# Patient Record
Sex: Male | Born: 1970 | Race: White | Marital: Married | Smoking: Never smoker
Health system: Southern US, Community
[De-identification: ages and names within clinical notes are randomized; demographics above are authoritative.]

## PROBLEM LIST (undated history)

## (undated) DIAGNOSIS — G4730 Sleep apnea, unspecified: Secondary | ICD-10-CM

## (undated) DIAGNOSIS — G709 Myoneural disorder, unspecified: Secondary | ICD-10-CM

## (undated) DIAGNOSIS — K219 Gastro-esophageal reflux disease without esophagitis: Secondary | ICD-10-CM

## (undated) DIAGNOSIS — F32A Depression, unspecified: Secondary | ICD-10-CM

## (undated) DIAGNOSIS — T8859XA Other complications of anesthesia, initial encounter: Secondary | ICD-10-CM

## (undated) DIAGNOSIS — N189 Chronic kidney disease, unspecified: Secondary | ICD-10-CM

## (undated) DIAGNOSIS — A159 Respiratory tuberculosis unspecified: Secondary | ICD-10-CM

## (undated) DIAGNOSIS — Z87442 Personal history of urinary calculi: Secondary | ICD-10-CM

## (undated) DIAGNOSIS — F329 Major depressive disorder, single episode, unspecified: Secondary | ICD-10-CM

## (undated) DIAGNOSIS — F419 Anxiety disorder, unspecified: Secondary | ICD-10-CM

## (undated) DIAGNOSIS — M1990 Unspecified osteoarthritis, unspecified site: Secondary | ICD-10-CM

## (undated) DIAGNOSIS — J189 Pneumonia, unspecified organism: Secondary | ICD-10-CM

## (undated) HISTORY — PX: TONSILLECTOMY: SUR1361

## (undated) HISTORY — PX: BACK SURGERY: SHX140

---

## 1998-09-15 ENCOUNTER — Ambulatory Visit (HOSPITAL_COMMUNITY): Admission: RE | Admit: 1998-09-15 | Discharge: 1998-09-15 | Payer: Self-pay | Admitting: Gastroenterology

## 1998-09-15 ENCOUNTER — Encounter: Payer: Self-pay | Admitting: Gastroenterology

## 1998-09-19 ENCOUNTER — Encounter: Payer: Self-pay | Admitting: *Deleted

## 1998-09-19 ENCOUNTER — Ambulatory Visit (HOSPITAL_COMMUNITY): Admission: RE | Admit: 1998-09-19 | Discharge: 1998-09-19 | Payer: Self-pay | Admitting: *Deleted

## 1998-09-21 ENCOUNTER — Ambulatory Visit (HOSPITAL_COMMUNITY): Admission: RE | Admit: 1998-09-21 | Discharge: 1998-09-21 | Payer: Self-pay | Admitting: *Deleted

## 1998-12-03 HISTORY — PX: ROTATOR CUFF REPAIR: SHX139

## 2017-01-31 HISTORY — PX: KNEE ARTHROSCOPY: SUR90

## 2017-10-15 ENCOUNTER — Ambulatory Visit: Payer: Self-pay | Admitting: Orthopedic Surgery

## 2017-10-22 ENCOUNTER — Ambulatory Visit: Payer: Self-pay | Admitting: Orthopedic Surgery

## 2017-10-22 NOTE — Progress Notes (Signed)
PRE-OP  Please note that a new consent form has been ordered to reflect the addition of a Right Knee Cortisone Injection to be performed at the time of the Left Total Knee Arthroplasty scheduled for Monday November 11, 2017.  Please update at the patient's preop surgical evaluation.  Thanks,   [name] Peacerew Bonnie Roig, PA-C

## 2017-10-27 ENCOUNTER — Ambulatory Visit: Payer: Self-pay | Admitting: Orthopedic Surgery

## 2017-10-27 NOTE — H&P (Signed)
Amado Nash DOB: 02-17-1971 Married / Language: English / Race: White Male Date of Admission:  11/11/2017 CC:  Bilateral knee pain History of Present Illness The patient is a 46 year old male who comes in for a preoperative History and Physical. The patient is scheduled for a left total knee arthroplasty and a right knee cortisone to be performed by Dr. Gus Rankin. Aluisio, MD at [facility] on 11-11-2017. The patient is a 46 year old male who presentd with knee complaints. The patient was seen today in referral from [place] . 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 Prior to being seen in the clinic, [facility]. Previous work-up for this problem has included knee x-rays, knee MRI (2016 per [place]), arthroscopy (x3 with the most recent being March 2018) and orthopedic consultation. Past treatment for this problem has included intra-articular injection of corticosteroids (multiple injections over the years). Complaints and include knee pain, swelling, stiffness and difficulty bearing weight. Current treatment includes non-opioid analgesics (Tylenol). [name] is a 46 year old [employer] veteran who came in for evaluation of ongoing left knee pain. 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 He was a member of the [employer] and worked as an [employer] spending time in [place] and also serving at the [employer].  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 He has had two knee scopes on the right knee and three knee scopes on the left knee with the most recent surgery earlier this year in  March. He descibes constant pain, pain at rest, pain at night, but increased pain with weight bearing activities. He has to stop going to the gym two years ago due to pain. The left knee has swelling, popping, giving way issues. He is unable to squatt or bend down. He has difficulty getting in and out of the care and up and down steps. Going down is worse than going up. He states that he has no quality of life and unable to play in the yard with his 96 yer old son. He has tried injections and bracing but still has progressive type knee pain.  His mother is a patient here and has had her knee replaced and his oolder brother has also had a knee replacement. Both of his family members are doing well with their surgery and he is now ready to pursure knee surgery. They have been treated conservatively in the past for the above stated problem and despite conservative measures, they continue to have progressive pain and severe functional limitations and dysfunction. They have failed non-operative management including home exercise, medications, and injections. It is felt that they would benefit from undergoing total joint replacement. Risks and benefits of the procedure have been discussed with the patient and they elect to proceed with surgery. There are no active contraindications to surgery such as ongoing infection or rapidly progressive neurological disease.   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  Social History   Children  2 Current work status  disabled Exercise  Exercises never Former drinker  09/20/2017: In the past drank beer only occasionally per week Living situation  live with spouse Marital status  married No history of drug/alcohol rehab  Not under pain contract  Number of flights of stairs before winded  greater than 5 Tobacco / smoke exposure  09/20/2017: no Tobacco use  Never smoker. 09/20/2017: smoke(d) less than 1/2 pack(s) per day uses less than 1/2 can(s) smokeless per week  Medication History Wellbutrin SR (Oral) Specific strength unknown - Active. Venlafaxine HCl (Oral) Specific strength unknown - Active. Prevacid (30MG  Capsule DR, Oral) Active. Vitamin D Active. Multivitamin Active. Zantac Active. Probiotic Active. Robaxin Active. PriLOSEC (Oral) Specific strength unknown - Active.  Past Surgical History Arthroscopy of Knee  bilateral Rotator Cuff Repair  right Spinal Surgery  Tonsillectomy    Review of Systems  General Not Present- Chills, Fatigue, Fever, Memory Loss, Night Sweats, Weight Gain and Weight Loss. Skin Not Present- Eczema, Hives, Itching, Lesions and Rash. HEENT Not Present- Dentures, Double Vision, Headache, Hearing Loss, Tinnitus and Visual Loss. Respiratory Not Present- Allergies, Chronic Cough, Coughing up blood, Shortness of breath at rest and Shortness of breath with exertion. Cardiovascular Not Present- Chest Pain, Difficulty Breathing Lying Down, Murmur, Palpitations, Racing/skipping heartbeats and Swelling. Gastrointestinal Not Present- Abdominal Pain, Bloody Stool, Constipation, Diarrhea, Difficulty Swallowing, Heartburn, Jaundice, Loss of appetitie, Nausea and Vomiting. Note: last diverticular flare August 2018 Male Genitourinary Not Present- Blood in Urine, Discharge, Flank Pain, Incontinence, Painful Urination, Urgency, Urinary frequency, Urinary Retention, Urinating at Night and Weak urinary stream. Musculoskeletal  Present- Joint Pain. Not Present- Back Pain, Joint Swelling, Morning Stiffness, Muscle Pain, Muscle Weakness and Spasms. Neurological Not Present- Blackout spells, Difficulty with balance, Dizziness, Paralysis, Tremor and Weakness. Psychiatric Not Present- Insomnia.  Vitals  Weight: 255 lb Height: 75in Weight was reported by patient. Height was reported by patient. Body Surface Area: 2.43 m Body Mass Index: 31.87 kg/m  Pulse: 88 (Regular)  BP: 118/82 (Sitting, Right Arm, Standard)   Physical Exam General Mental Status -Alert, cooperative and good historian. General Appearance-pleasant, Not in acute distress. Orientation-Oriented X3. Build & Nutrition-Well nourished and Well developed. Note: tall-framed  Head and Neck Head-normocephalic, atraumatic . Neck Global Assessment - supple, no bruit auscultated on the right, no bruit auscultated on the left.  Eye Vision-Wears corrective lenses. Pupil - Bilateral-Regular and Round. Motion - Bilateral-EOMI.  Chest and Lung Exam Auscultation Breath sounds - clear at anterior chest wall and clear at posterior chest wall. Adventitious sounds - No Adventitious sounds.  Cardiovascular Auscultation Rhythm - Regular rate and rhythm. Heart Sounds - S1 WNL and S2 WNL. Murmurs & Other Heart Sounds - Auscultation of the heart reveals - No Murmurs.  Abdomen Palpation/Percussion Tenderness - Abdomen is non-tender to palpation. Rigidity (guarding) - Abdomen is soft. Auscultation Auscultation of the abdomen reveals - Bowel sounds normal.  Male Genitourinary Note: Not done, not pertinent to present illness   Musculoskeletal Note: Examination of the right hip shows flexion to 120 rotation in 30 abduction 40 and external rotation of 40. There is no tenderness over the greater trochanter. There is no pain on provocative testing of the hip.Evaluation of the left hip shows  flexion to 120 rotation in 30 out 40 and  abduction 40 without discomfort. There is no tenderness over the greater trochanter. There is no pain on provocative testing of the hip. His RIGHT knee shows no effusion. There is marked crepitus on range of motion. Range 0-125. He is tender medial greater than lateral with no instability. His LEFT knee shows no effusion. He has got a slight varus deformity. Range 5-125. He is tender medial and lateral with no instability. Pulses, sensation and motor are intact both lower extremities. Gait pattern is antalgic LEFT worse than RIGHT side.  Radiographs AP both knees and lateral show severe end-stage arthritic change of the LEFT worse than RIGHT knee. On the LEFT is bone on bone medial and patellofemoral with varus deformity. On the RIGHT he is near bone-on-bone patellofemoral and is bone-on-bone medial.   Assessment & Plan  Primary osteoarthritis of right knee (M17.11) Primary osteoarthritis of left knee (M17.12)  Note:Surgical Plans: Left Total Knee Replacement and Right Knee Cortisone Injection  Disposition: Home with Wife, [place] Out[place] [facility] [place]  PCP: Dr. Florene RoutePantie, [facility]  IV [place]  Anesthesia Issues: None  Patient was instructed on what medications to stop prior to surgery.  Signed electronically by Lauraine RinneAlexzandrew L Italo Banton, III PA-C

## 2017-10-27 NOTE — H&P (View-Only) (Signed)
Nicholas Nash DOB: 02-17-1971 Married / Language: English / Race: White Male Date of Admission:  11/11/2017 CC:  Bilateral knee pain History of Present Illness The patient is a 46 year old male who comes in for a preoperative History and Physical. The patient is scheduled for a left total knee arthroplasty and a right knee cortisone to be performed by Dr. Gus Nash. Aluisio, MD at [facility] on 11-11-2017. The patient is a 46 year old male who presentd with knee complaints. The patient was seen today in referral from Nicholas Nash . 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 Prior to being seen in the clinic, [facility]. Previous work-up for this problem has included knee x-rays, knee MRI (2016 per [place] Nash), arthroscopy (x3 with the most recent being March 2018) and orthopedic consultation. Past treatment for this problem has included intra-articular injection of corticosteroids (multiple injections over the years). Complaints and include knee pain, swelling, stiffness and difficulty bearing weight. Current treatment includes non-opioid analgesics (Tylenol). [name] is a 46 year old [employer] veteran who came in for evaluation of ongoing left knee pain. 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 He was a member of the [employer] and worked as an [employer] spending time in [place] and also serving at the [employer].  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 He has had two knee scopes on the right knee and three knee scopes on the left knee with the most recent surgery earlier this year in  March. He descibes constant pain, pain at rest, pain at night, but increased pain with weight bearing activities. He has to stop going to the gym two years ago due to pain. The left knee has swelling, popping, giving way issues. He is unable to squatt or bend down. He has difficulty getting in and out of the care and up and down steps. Going down is worse than going up. He states that he has no quality of life and unable to play in the yard with his 96 yer old son. He has tried injections and bracing but still has progressive type knee pain.  His mother is a patient here and has had her knee replaced and his oolder brother has also had a knee replacement. Both of his family members are doing well with their surgery and he is now ready to pursure knee surgery. They have been treated conservatively in the past for the above stated problem and despite conservative measures, they continue to have progressive pain and severe functional limitations and dysfunction. They have failed non-operative management including home exercise, medications, and injections. It is felt that they would benefit from undergoing total joint replacement. Risks and benefits of the procedure have been discussed with the patient and they elect to proceed with surgery. There are no active contraindications to surgery such as ongoing infection or rapidly progressive neurological disease.   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  Social History   Children  2 Current work status  disabled Exercise  Exercises never Former drinker  09/20/2017: In the past drank beer only occasionally per week Living situation  live with spouse Marital status  married No history of drug/alcohol rehab  Not under pain contract  Number of flights of stairs before winded  greater than 5 Tobacco / smoke exposure  09/20/2017: no Tobacco use  Never smoker. 09/20/2017: smoke(d) less than 1/2 pack(s) per day uses less than 1/2 can(s) smokeless per week  Medication History Wellbutrin SR (Oral) Specific strength unknown - Active. Venlafaxine HCl (Oral) Specific strength unknown - Active. Prevacid (30MG  Capsule DR, Oral) Active. Vitamin D Active. Multivitamin Active. Zantac Active. Probiotic Active. Robaxin Active. PriLOSEC (Oral) Specific strength unknown - Active.  Past Surgical History Arthroscopy of Knee  bilateral Rotator Cuff Repair  right Spinal Surgery  Tonsillectomy    Review of Systems  General Not Present- Chills, Fatigue, Fever, Memory Loss, Night Sweats, Weight Gain and Weight Loss. Skin Not Present- Eczema, Hives, Itching, Lesions and Rash. HEENT Not Present- Dentures, Double Vision, Headache, Hearing Loss, Tinnitus and Visual Loss. Respiratory Not Present- Allergies, Chronic Cough, Coughing up blood, Shortness of breath at rest and Shortness of breath with exertion. Cardiovascular Not Present- Chest Pain, Difficulty Breathing Lying Down, Murmur, Palpitations, Racing/skipping heartbeats and Swelling. Gastrointestinal Not Present- Abdominal Pain, Bloody Stool, Constipation, Diarrhea, Difficulty Swallowing, Heartburn, Jaundice, Loss of appetitie, Nausea and Vomiting. Note: last diverticular flare August 2018 Male Genitourinary Not Present- Blood in Urine, Discharge, Flank Pain, Incontinence, Painful Urination, Urgency, Urinary frequency, Urinary Retention, Urinating at Night and Weak urinary stream. Musculoskeletal  Present- Joint Pain. Not Present- Back Pain, Joint Swelling, Morning Stiffness, Muscle Pain, Muscle Weakness and Spasms. Neurological Not Present- Blackout spells, Difficulty with balance, Dizziness, Paralysis, Tremor and Weakness. Psychiatric Not Present- Insomnia.  Vitals  Weight: 255 lb Height: 75in Weight was reported by patient. Height was reported by patient. Body Surface Area: 2.43 m Body Mass Index: 31.87 kg/m  Pulse: 88 (Regular)  BP: 118/82 (Sitting, Right Arm, Standard)   Physical Exam General Mental Status -Alert, cooperative and good historian. General Appearance-pleasant, Not in acute distress. Orientation-Oriented X3. Build & Nutrition-Well nourished and Well developed. Note: tall-framed  Head and Neck Head-normocephalic, atraumatic . Neck Global Assessment - supple, no bruit auscultated on the right, no bruit auscultated on the left.  Eye Vision-Wears corrective lenses. Pupil - Bilateral-Regular and Round. Motion - Bilateral-EOMI.  Chest and Lung Exam Auscultation Breath sounds - clear at anterior chest wall and clear at posterior chest wall. Adventitious sounds - No Adventitious sounds.  Cardiovascular Auscultation Rhythm - Regular rate and rhythm. Heart Sounds - S1 WNL and S2 WNL. Murmurs & Other Heart Sounds - Auscultation of the heart reveals - No Murmurs.  Abdomen Palpation/Percussion Tenderness - Abdomen is non-tender to palpation. Rigidity (guarding) - Abdomen is soft. Auscultation Auscultation of the abdomen reveals - Bowel sounds normal.  Male Genitourinary Note: Not done, not pertinent to present illness   Musculoskeletal Note: Examination of the right hip shows flexion to 120 rotation in 30 abduction 40 and external rotation of 40. There is no tenderness over the greater trochanter. There is no pain on provocative testing of the hip.Evaluation of the left hip shows  flexion to 120 rotation in 30 out 40 and  abduction 40 without discomfort. There is no tenderness over the greater trochanter. There is no pain on provocative testing of the hip. His RIGHT knee shows no effusion. There is marked crepitus on range of motion. Range 0-125. He is tender medial greater than lateral with no instability. His LEFT knee shows no effusion. He has got a slight varus deformity. Range 5-125. He is tender medial and lateral with no instability. Pulses, sensation and motor are intact both lower extremities. Gait pattern is antalgic LEFT worse than RIGHT side.  Radiographs AP both knees and lateral show severe end-stage arthritic change of the LEFT worse than RIGHT knee. On the LEFT is bone on bone medial and patellofemoral with varus deformity. On the RIGHT he is near bone-on-bone patellofemoral and is bone-on-bone medial.   Assessment & Plan  Primary osteoarthritis of right knee (M17.11) Primary osteoarthritis of left knee (M17.12)  Note:Surgical Plans: Left Total Knee Replacement and Right Knee Cortisone Injection  Disposition: Home with Wife, Stra[place] Outpatient Therapy[place] [facility] [place]  PCP: Dr. Florene RoutePantie, [facility]  IV [place]  Anesthesia Issues: None  Patient was instructed on what medications to stop prior to surgery.  Signed electronically by Lauraine RinneAlexzandrew L Perkins, III PA-C

## 2017-11-06 NOTE — Patient Instructions (Addendum)
Nicholas NashMichael W Ardito  11/06/2017   Your procedure is scheduled on: 11-11-17   Report to [facility]  Entrance Report to Admitting at 8:00 AM   Call this number if you have problems the morning of surgery [phone]   Remember: ONLY 1 PERSON MAY GO WITH YOU TO SHORT STAY TO GET  READY MORNING OF YOUR SURGERY.  Do not eat food or drink liquids :After Midnight.     Take these medicines the morning of surgery with A SIP OF WATER: Bupropion (Wellbutrin), Ranitidine (Zantac), Venlafaxine XR (Effexor-XR)                                You may not have any metal on your body including hair pins and              piercings  Do not wear jewelry,  lotions, powders or deodorant             Men may shave face and neck.   Do not bring valuables to the hospital. [facility] IS NOT             RESPONSIBLE   FOR VALUABLES.  Contacts, dentures or bridgework may not be worn into surgery.  Leave suitcase in the car. After surgery it may be brought to your room.                Please read over the following fact sheets you were given: _____________________________________________________________________             [facility] - Preparing for [facility] Before surgery, you can play an important role.  Because skin is not sterile, your skin needs to be as free of germs as possible.  You can reduce the number of germs on your skin by washing with CHG (chlorahexidine gluconate) soap before surgery.  CHG is an antiseptic cleaner which kills germs and bonds with the skin to continue killing germs even after washing. Please DO NOT use if you have an allergy to CHG or antibacterial soaps.  If your skin becomes reddened/irritated stop using the CHG and inform your nurse when you arrive at Short Stay. Do not shave (including legs and underarms) for at least 48 hours prior to the first CHG shower.  You may shave your face/neck. Please follow these instructions carefully:  1.  Shower with  CHG Soap the night before surgery and the  morning of Surgery.  2.  If you choose to wash your hair, wash your hair first as usual with your  normal  shampoo.  3.  After you shampoo, rinse your hair and body thoroughly to remove the  shampoo.                           4.  Use CHG as you would any other liquid soap.  You can apply chg directly  to the skin and wash                       Gently with a scrungie or clean washcloth.  5.  Apply the CHG Soap to your body ONLY FROM THE NECK DOWN.   Do not use on face/ open  Wound or open sores. Avoid contact with eyes, ears mouth and genitals (private parts).                       Wash face,  Genitals (private parts) with your normal soap.             6.  Wash thoroughly, paying special attention to the area where your surgery  will be performed.  7.  Thoroughly rinse your body with warm water from the neck down.  8.  DO NOT shower/wash with your normal soap after using and rinsing off  the CHG Soap.                9.  Pat yourself dry with a clean towel.            10.  Wear clean pajamas.            11.  Place clean sheets on your bed the night of your first shower and do not  sleep with pets. Day of Surgery : Do not apply any lotions/deodorants the morning of surgery.  Please wear clean clothes to the hospital/surgery center.  FAILURE TO FOLLOW THESE INSTRUCTIONS MAY RESULT IN THE CANCELLATION OF YOUR SURGERY PATIENT SIGNATURE_________________________________  NURSE SIGNATURE__________________________________  ________________________________________________________________________   Adam Phenix  An incentive spirometer is a tool that can help keep your lungs clear and active. This tool measures how well you are filling your lungs with each breath. Taking long deep breaths may help reverse or decrease the chance of developing breathing (pulmonary) problems (especially infection) following:  A long period of  time when you are unable to move or be active. BEFORE THE PROCEDURE   If the spirometer includes an indicator to show your best effort, your nurse or respiratory therapist will set it to a desired goal.  If possible, sit up straight or lean slightly forward. Try not to slouch.  Hold the incentive spirometer in an upright position. INSTRUCTIONS FOR USE  1. Sit on the edge of your bed if possible, or sit up as far as you can in bed or on a chair. 2. Hold the incentive spirometer in an upright position. 3. Breathe out normally. 4. Place the mouthpiece in your mouth and seal your lips tightly around it. 5. Breathe in slowly and as deeply as possible, raising the piston or the ball toward the top of the column. 6. Hold your breath for 3-5 seconds or for as long as possible. Allow the piston or ball to fall to the bottom of the column. 7. Remove the mouthpiece from your mouth and breathe out normally. 8. Rest for a few seconds and repeat Steps 1 through 7 at least 10 times every 1-2 hours when you are awake. Take your time and take a few normal breaths between deep breaths. 9. The spirometer may include an indicator to show your best effort. Use the indicator as a goal to work toward during each repetition. 10. After each set of 10 deep breaths, practice coughing to be sure your lungs are clear. If you have an incision (the cut made at the time of surgery), support your incision when coughing by placing a pillow or rolled up towels firmly against it. Once you are able to get out of bed, walk around indoors and cough well. You may stop using the incentive spirometer when instructed by your caregiver.  RISKS AND COMPLICATIONS  Take your time so you do not get  dizzy or light-headed.  If you are in pain, you may need to take or ask for pain medication before doing incentive spirometry. It is harder to take a deep breath if you are having pain. AFTER USE  Rest and breathe slowly and easily.  It can  be helpful to keep track of a log of your progress. Your caregiver can provide you with a simple table to help with this. If you are using the spirometer at home, follow these instructions: [place] IF:   You are having difficultly using the spirometer.  You have trouble using the spirometer as often as instructed.  Your pain medication is not giving enough relief while using the spirometer.  You develop fever of 100.5 F (38.1 C) or higher. SEEK IMMEDIATE MEDICAL CARE IF:   You cough up bloody sputum that had not been present before.  You develop fever of 102 F (38.9 C) or greater.  You develop worsening pain at or near the incision site. MAKE SURE YOU:   Understand these instructions.  Will watch your condition.  Will get help right away if you are not doing well or get worse. Document Released: 04/01/2007 Document Revised: 02/11/2012 Document Reviewed: 06/02/2007 Exit[employer] Patient Information 2014 [employer], [place].   ________________________________________________________________________  WHAT IS A BLOOD TRANSFUSION? Blood Transfusion Information  A transfusion is the replacement of blood or some of its parts. Blood is made up of multiple cells which provide different functions.  Red blood cells carry oxygen and are used for blood loss replacement.  White blood cells fight against infection.  Platelets control bleeding.  Plasma helps clot blood.  Other blood products are available for specialized needs, such as hemophilia or other clotting disorders. BEFORE THE TRANSFUSION  Who gives blood for transfusions?   Healthy volunteers who are fully evaluated to make sure their blood is safe. This is blood bank blood. Transfusion therapy is the safest it has ever been in the practice of medicine. Before blood is taken from a donor, a complete history is taken to make sure that person has no history of diseases nor engages in risky social behavior (examples are  intravenous drug use or sexual activity with multiple partners). The donor's travel history is screened to minimize risk of transmitting infections, such as malaria. The donated blood is tested for signs of infectious diseases, such as HIV and hepatitis. The blood is then tested to be sure it is compatible with you in order to minimize the chance of a transfusion reaction. If you or a relative donates blood, this is often done in anticipation of surgery and is not appropriate for emergency situations. It takes many days to process the donated blood. RISKS AND COMPLICATIONS Although transfusion therapy is very safe and saves many lives, the main dangers of transfusion include:   Getting an infectious disease.  Developing a transfusion reaction. This is an allergic reaction to something in the blood you were given. Every precaution is taken to prevent this. The decision to have a blood transfusion has been considered carefully by your caregiver before blood is given. Blood is not given unless the benefits outweigh the risks. AFTER THE TRANSFUSION  Right after receiving a blood transfusion, you will usually feel much better and more energetic. This is especially true if your red blood cells have gotten low (anemic). The transfusion raises the level of the red blood cells which carry oxygen, and this usually causes an energy increase.  The nurse administering the transfusion will  monitor you carefully for complications. HOME CARE INSTRUCTIONS  No special instructions are needed after a transfusion. You may find your energy is better. Speak with your caregiver about any limitations on activity for underlying diseases you may have. SEEK MEDICAL CARE IF:   Your condition is not improving after your transfusion.  You develop redness or irritation at the intravenous (IV) site. SEEK IMMEDIATE MEDICAL CARE IF:  Any of the following symptoms occur over the next 12 hours:  Shaking chills.  You have a  temperature by mouth above 102 F (38.9 C), not controlled by medicine.  Chest, back, or muscle pain.  People around you feel you are not acting correctly or are confused.  Shortness of breath or difficulty breathing.  Dizziness and fainting.  You get a rash or develop hives.  You have a decrease in urine output.  Your urine turns a dark color or changes to pink, red, or brown. Any of the following symptoms occur over the next 10 days:  You have a temperature by mouth above 102 F (38.9 C), not controlled by medicine.  Shortness of breath.  Weakness after normal activity.  The white part of the eye turns yellow (jaundice).  You have a decrease in the amount of urine or are urinating less often.  Your urine turns a dark color or changes to pink, red, or brown. Document Released: 11/16/2000 Document Revised: 02/11/2012 Document Reviewed: 07/05/2008 [facility] Patient Information 2014 [place], [place].  _______________________________________________________________________

## 2017-11-06 NOTE — Progress Notes (Signed)
10-16-17 Cardiac clearance from Dr. Andee PolesHumphrey, [facility],  [facility] and EKG on chart  08-13-17 Surgical clearance from Dr. Edwina BarthPantio on chart  10-15-17 CXR on chart

## 2017-11-07 ENCOUNTER — Other Ambulatory Visit: Payer: Self-pay

## 2017-11-07 ENCOUNTER — Encounter (INDEPENDENT_AMBULATORY_CARE_PROVIDER_SITE_OTHER): Payer: Self-pay

## 2017-11-07 ENCOUNTER — Encounter (HOSPITAL_COMMUNITY): Payer: Self-pay

## 2017-11-07 ENCOUNTER — Encounter (HOSPITAL_COMMUNITY)
Admission: RE | Admit: 2017-11-07 | Discharge: 2017-11-07 | Disposition: A | Discharge status: Home / Self Care | Payer: Non-veteran care | Source: Ambulatory Visit | Attending: Orthopedic Surgery | Admitting: Orthopedic Surgery

## 2017-11-07 DIAGNOSIS — M1712 Unilateral primary osteoarthritis, left knee: Secondary | ICD-10-CM | POA: Insufficient documentation

## 2017-11-07 DIAGNOSIS — Z01812 Encounter for preprocedural laboratory examination: Secondary | ICD-10-CM | POA: Diagnosis not present

## 2017-11-07 HISTORY — DX: Depression, unspecified: F32.A

## 2017-11-07 HISTORY — DX: Major depressive disorder, single episode, unspecified: F32.9

## 2017-11-07 HISTORY — DX: Gastro-esophageal reflux disease without esophagitis: K21.9

## 2017-11-07 HISTORY — DX: Anxiety disorder, unspecified: F41.9

## 2017-11-07 HISTORY — DX: Unspecified osteoarthritis, unspecified site: M19.90

## 2017-11-07 HISTORY — DX: Sleep apnea, unspecified: G47.30

## 2017-11-07 HISTORY — DX: Chronic kidney disease, unspecified: N18.9

## 2017-11-07 LAB — COMPREHENSIVE METABOLIC PANEL
ALK PHOS: 71 U/L (ref 38–126)
ALT: 31 U/L (ref 17–63)
ANION GAP: 8 (ref 5–15)
AST: 31 U/L (ref 15–41)
Albumin: 4.6 g/dL (ref 3.5–5.0)
BUN: 15 mg/dL (ref 6–20)
CALCIUM: 9.5 mg/dL (ref 8.9–10.3)
CO2: 26 mmol/L (ref 22–32)
Chloride: 105 mmol/L (ref 101–111)
Creatinine, Ser: 1.3 mg/dL — ABNORMAL HIGH (ref 0.61–1.24)
GFR calc non Af Amer: >60 mL/min (ref >60)
Glucose, Bld: 99 mg/dL (ref 65–99)
Potassium: 3.9 mmol/L (ref 3.5–5.1)
SODIUM: 139 mmol/L (ref 135–145)
Total Bilirubin: 0.9 mg/dL (ref 0.3–1.2)
Total Protein: 7.8 g/dL (ref 6.5–8.1)

## 2017-11-07 LAB — CBC
HCT: 45.7 % (ref 39.0–52.0)
HEMOGLOBIN: 15.9 g/dL (ref 13.0–17.0)
MCH: 30.3 pg (ref 26.0–34.0)
MCHC: 34.8 g/dL (ref 30.0–36.0)
MCV: 87.2 fL (ref 78.0–100.0)
PLATELETS: 285 10*3/uL (ref 150–400)
RBC: 5.24 MIL/uL (ref 4.22–5.81)
RDW: 13.5 % (ref 11.5–15.5)
WBC: 7.4 10*3/uL (ref 4.0–10.5)

## 2017-11-07 LAB — ABO/RH: ABO/RH(D): A NEG

## 2017-11-07 LAB — APTT: aPTT: 27 seconds (ref 24–36)

## 2017-11-07 LAB — PROTIME-INR
INR: 0.97
Prothrombin Time: 12.8 seconds (ref 11.4–15.2)

## 2017-11-07 LAB — SURGICAL PCR SCREEN
MRSA, PCR: NEGATIVE
Staphylococcus aureus: POSITIVE — AB

## 2017-11-07 NOTE — Progress Notes (Signed)
10-16-17 EKG on chart

## 2017-11-08 NOTE — Progress Notes (Signed)
11-07-17 PCR result routed to Dr. Lequita HaltAluisio for review.

## 2017-11-11 ENCOUNTER — Inpatient Hospital Stay (HOSPITAL_COMMUNITY): Admission: RE | Admit: 2017-11-11 | Payer: Non-veteran care | Source: Ambulatory Visit | Admitting: Orthopedic Surgery

## 2017-11-11 NOTE — Progress Notes (Signed)
SPOKE WITH PATIENT BY PHONE AND MADE AWARE ARRIVE 1530 PM 11-13-17 [facility] SHORT STAY. NPO AFTER MIDNIGHT FOO, MAY HAVE CLEAR LIQUIDS FROM MIDNGHT UNTIL 1155 AM THEN NPO, FOLLOW ALL OTHER PRE OP INSTRUCTIONS GIVEN.

## 2017-11-13 ENCOUNTER — Encounter (HOSPITAL_COMMUNITY)
Admission: RE | Disposition: A | Discharge status: Home / Self Care | Payer: Self-pay | Source: Ambulatory Visit | Attending: Orthopedic Surgery

## 2017-11-13 ENCOUNTER — Encounter (HOSPITAL_COMMUNITY): Payer: Self-pay | Admitting: *Deleted

## 2017-11-13 ENCOUNTER — Encounter (HOSPITAL_COMMUNITY): Admission: RE | Payer: Self-pay | Source: Ambulatory Visit

## 2017-11-13 ENCOUNTER — Inpatient Hospital Stay (HOSPITAL_COMMUNITY): Payer: Non-veteran care | Admitting: Anesthesiology

## 2017-11-13 ENCOUNTER — Inpatient Hospital Stay (HOSPITAL_COMMUNITY)
Admission: RE | Admit: 2017-11-13 | Discharge: 2017-11-15 | DRG: 470 | Disposition: A | Discharge status: Home / Self Care | Payer: Non-veteran care | Source: Ambulatory Visit | Attending: Orthopedic Surgery | Admitting: Orthopedic Surgery

## 2017-11-13 ENCOUNTER — Other Ambulatory Visit: Payer: Self-pay

## 2017-11-13 DIAGNOSIS — F4310 Post-traumatic stress disorder, unspecified: Secondary | ICD-10-CM | POA: Diagnosis not present

## 2017-11-13 DIAGNOSIS — G8929 Other chronic pain: Secondary | ICD-10-CM | POA: Diagnosis not present

## 2017-11-13 DIAGNOSIS — Z6832 Body mass index (BMI) 32.0-32.9, adult: Secondary | ICD-10-CM | POA: Diagnosis not present

## 2017-11-13 DIAGNOSIS — N189 Chronic kidney disease, unspecified: Secondary | ICD-10-CM | POA: Diagnosis present

## 2017-11-13 DIAGNOSIS — K589 Irritable bowel syndrome without diarrhea: Secondary | ICD-10-CM | POA: Diagnosis present

## 2017-11-13 DIAGNOSIS — M1710 Unilateral primary osteoarthritis, unspecified knee: Secondary | ICD-10-CM

## 2017-11-13 DIAGNOSIS — F329 Major depressive disorder, single episode, unspecified: Secondary | ICD-10-CM | POA: Diagnosis present

## 2017-11-13 DIAGNOSIS — K219 Gastro-esophageal reflux disease without esophagitis: Secondary | ICD-10-CM | POA: Diagnosis present

## 2017-11-13 DIAGNOSIS — M179 Osteoarthritis of knee, unspecified: Secondary | ICD-10-CM | POA: Diagnosis present

## 2017-11-13 DIAGNOSIS — E669 Obesity, unspecified: Secondary | ICD-10-CM | POA: Diagnosis present

## 2017-11-13 DIAGNOSIS — M1712 Unilateral primary osteoarthritis, left knee: Secondary | ICD-10-CM | POA: Diagnosis not present

## 2017-11-13 DIAGNOSIS — G4730 Sleep apnea, unspecified: Secondary | ICD-10-CM | POA: Diagnosis not present

## 2017-11-13 HISTORY — PX: TOTAL KNEE ARTHROPLASTY: SHX125

## 2017-11-13 SURGERY — ARTHROPLASTY, KNEE, TOTAL
Anesthesia: Choice | Site: Knee | Laterality: Left

## 2017-11-13 SURGERY — ARTHROPLASTY, KNEE, TOTAL
Anesthesia: Spinal | Site: Knee | Laterality: Left

## 2017-11-13 MED ORDER — DEXAMETHASONE SODIUM PHOSPHATE 10 MG/ML IJ SOLN
10.0000 mg | Freq: Once | INTRAMUSCULAR | Status: AC
Start: 2017-11-13 — End: 2017-11-13
  Administered 2017-11-13: 10 mg via INTRAVENOUS

## 2017-11-13 MED ORDER — ONDANSETRON HCL 4 MG/2ML IJ SOLN
INTRAMUSCULAR | Status: AC
  Filled 2017-11-13: qty 2

## 2017-11-13 MED ORDER — DOCUSATE SODIUM 100 MG PO CAPS
100.0000 mg | ORAL_CAPSULE | Freq: Two times a day (BID) | ORAL | Status: DC
  Administered 2017-11-13 – 2017-11-15 (×4): 100 mg via ORAL
  Filled 2017-11-13 (×4): qty 1

## 2017-11-13 MED ORDER — GABAPENTIN 300 MG PO CAPS
300.0000 mg | ORAL_CAPSULE | Freq: Once | ORAL | Status: AC
  Administered 2017-11-13: 300 mg via ORAL

## 2017-11-13 MED ORDER — METHOCARBAMOL 1000 MG/10ML IJ SOLN
500.0000 mg | Freq: Four times a day (QID) | INTRAVENOUS | Status: DC | PRN
  Filled 2017-11-13: qty 5

## 2017-11-13 MED ORDER — TRANEXAMIC ACID 1000 MG/10ML IV SOLN
1000.0000 mg | Freq: Once | INTRAVENOUS | Status: AC
  Administered 2017-11-13: 1000 mg via INTRAVENOUS
  Filled 2017-11-13: qty 1100

## 2017-11-13 MED ORDER — BUPIVACAINE LIPOSOME 1.3 % IJ SUSP
INTRAMUSCULAR | Status: DC | PRN
  Administered 2017-11-13 (×2): 10 mL

## 2017-11-13 MED ORDER — METHOCARBAMOL 500 MG PO TABS
500.0000 mg | ORAL_TABLET | Freq: Four times a day (QID) | ORAL | Status: DC | PRN
  Administered 2017-11-14 – 2017-11-15 (×4): 500 mg via ORAL
  Filled 2017-11-13 (×4): qty 1

## 2017-11-13 MED ORDER — SODIUM CHLORIDE 0.9 % IR SOLN
Status: DC | PRN
  Administered 2017-11-13: 1000 mL

## 2017-11-13 MED ORDER — PROPOFOL 10 MG/ML IV BOLUS
INTRAVENOUS | Status: AC
  Filled 2017-11-13: qty 40

## 2017-11-13 MED ORDER — FAMOTIDINE 20 MG PO TABS
20.0000 mg | ORAL_TABLET | Freq: Every day | ORAL | Status: DC
  Administered 2017-11-14 – 2017-11-15 (×2): 20 mg via ORAL
  Filled 2017-11-13 (×2): qty 1

## 2017-11-13 MED ORDER — DIPHENHYDRAMINE HCL 12.5 MG/5ML PO ELIX: 12.5000 mg | ORAL_SOLUTION | ORAL | Status: DC | PRN

## 2017-11-13 MED ORDER — BUPIVACAINE IN DEXTROSE 0.75-8.25 % IT SOLN
INTRATHECAL | Status: DC | PRN
  Administered 2017-11-13: 2 mL via INTRATHECAL

## 2017-11-13 MED ORDER — ONDANSETRON HCL 4 MG/2ML IJ SOLN
INTRAMUSCULAR | Status: DC | PRN
  Administered 2017-11-13: 4 mg via INTRAVENOUS

## 2017-11-13 MED ORDER — POLYETHYLENE GLYCOL 3350 17 G PO PACK: 17.0000 g | PACK | Freq: Every day | ORAL | Status: DC | PRN

## 2017-11-13 MED ORDER — TRANEXAMIC ACID 1000 MG/10ML IV SOLN
1000.0000 mg | INTRAVENOUS | Status: AC
  Administered 2017-11-13: 1000 mg via INTRAVENOUS
  Filled 2017-11-13: qty 1100

## 2017-11-13 MED ORDER — OXYCODONE HCL 5 MG PO TABS
10.0000 mg | ORAL_TABLET | ORAL | Status: DC | PRN
  Administered 2017-11-13 – 2017-11-15 (×9): 10 mg via ORAL
  Filled 2017-11-13 (×9): qty 2

## 2017-11-13 MED ORDER — ACETAMINOPHEN 10 MG/ML IV SOLN
INTRAVENOUS | Status: AC
  Filled 2017-11-13: qty 100

## 2017-11-13 MED ORDER — BISACODYL 10 MG RE SUPP: 10.0000 mg | Freq: Every day | RECTAL | Status: DC | PRN

## 2017-11-13 MED ORDER — PROPOFOL 500 MG/50ML IV EMUL
INTRAVENOUS | Status: DC | PRN
  Administered 2017-11-13 (×2): 20 mg via INTRAVENOUS

## 2017-11-13 MED ORDER — ACETAMINOPHEN 325 MG PO TABS: 650.0000 mg | ORAL_TABLET | ORAL | Status: DC | PRN

## 2017-11-13 MED ORDER — PROPOFOL 500 MG/50ML IV EMUL
INTRAVENOUS | Status: DC | PRN
  Administered 2017-11-13: 50 ug/kg/min via INTRAVENOUS

## 2017-11-13 MED ORDER — KETAMINE HCL 10 MG/ML IJ SOLN
INTRAMUSCULAR | Status: AC
  Filled 2017-11-13: qty 1

## 2017-11-13 MED ORDER — BUPROPION HCL 100 MG PO TABS
100.0000 mg | ORAL_TABLET | Freq: Every day | ORAL | Status: DC
  Administered 2017-11-14 – 2017-11-15 (×2): 100 mg via ORAL
  Filled 2017-11-13 (×2): qty 1

## 2017-11-13 MED ORDER — STERILE WATER FOR IRRIGATION IR SOLN
Status: DC | PRN
  Administered 2017-11-13: 2000 mL

## 2017-11-13 MED ORDER — 0.9 % SODIUM CHLORIDE (POUR BTL) OPTIME
TOPICAL | Status: DC | PRN
  Administered 2017-11-13: 1000 mL

## 2017-11-13 MED ORDER — CHLORHEXIDINE GLUCONATE 4 % EX LIQD: 60.0000 mL | Freq: Once | CUTANEOUS | Status: DC

## 2017-11-13 MED ORDER — DEXAMETHASONE SODIUM PHOSPHATE 10 MG/ML IJ SOLN
10.0000 mg | Freq: Once | INTRAMUSCULAR | Status: AC
  Administered 2017-11-14: 11:00:00 10 mg via INTRAVENOUS
  Filled 2017-11-13: qty 1

## 2017-11-13 MED ORDER — CEFAZOLIN SODIUM-DEXTROSE 2-4 GM/100ML-% IV SOLN
INTRAVENOUS | Status: AC
  Filled 2017-11-13: qty 100

## 2017-11-13 MED ORDER — MENTHOL 3 MG MT LOZG: 1.0000 | LOZENGE | OROMUCOSAL | Status: DC | PRN

## 2017-11-13 MED ORDER — RIVAROXABAN 10 MG PO TABS
10.0000 mg | ORAL_TABLET | Freq: Every day | ORAL | Status: DC
  Administered 2017-11-14 – 2017-11-15 (×2): 10 mg via ORAL
  Filled 2017-11-13 (×2): qty 1

## 2017-11-13 MED ORDER — METOCLOPRAMIDE HCL 5 MG/ML IJ SOLN: 5.0000 mg | Freq: Three times a day (TID) | INTRAMUSCULAR | Status: DC | PRN

## 2017-11-13 MED ORDER — PHENOL 1.4 % MT LIQD: 1.0000 | OROMUCOSAL | Status: DC | PRN

## 2017-11-13 MED ORDER — VENLAFAXINE HCL ER 75 MG PO CP24
75.0000 mg | ORAL_CAPSULE | Freq: Every day | ORAL | Status: DC
  Administered 2017-11-14 – 2017-11-15 (×2): 75 mg via ORAL
  Filled 2017-11-13: qty 1
  Filled 2017-11-13: qty 2

## 2017-11-13 MED ORDER — FENTANYL CITRATE (PF) 100 MCG/2ML IJ SOLN
INTRAMUSCULAR | Status: AC
  Administered 2017-11-13: 100 ug
  Filled 2017-11-13: qty 2

## 2017-11-13 MED ORDER — ACETAMINOPHEN 650 MG RE SUPP: 650.0000 mg | RECTAL | Status: DC | PRN

## 2017-11-13 MED ORDER — SODIUM CHLORIDE 0.9 % IV SOLN
INTRAVENOUS | Status: DC
  Administered 2017-11-13: 21:00:00 via INTRAVENOUS

## 2017-11-13 MED ORDER — ONDANSETRON HCL 4 MG/2ML IJ SOLN: 4.0000 mg | Freq: Four times a day (QID) | INTRAMUSCULAR | Status: DC | PRN

## 2017-11-13 MED ORDER — LACTATED RINGERS IV SOLN
INTRAVENOUS | Status: DC
  Administered 2017-11-13 (×3): via INTRAVENOUS

## 2017-11-13 MED ORDER — KETAMINE HCL 10 MG/ML IJ SOLN
INTRAMUSCULAR | Status: DC | PRN
  Administered 2017-11-13 (×2): 20 mg via INTRAVENOUS
  Administered 2017-11-13: 10 mg via INTRAVENOUS

## 2017-11-13 MED ORDER — PHENYLEPHRINE 40 MCG/ML (10ML) SYRINGE FOR IV PUSH (FOR BLOOD PRESSURE SUPPORT)
PREFILLED_SYRINGE | INTRAVENOUS | Status: AC
  Filled 2017-11-13: qty 10

## 2017-11-13 MED ORDER — MIDAZOLAM HCL 5 MG/5ML IJ SOLN
INTRAMUSCULAR | Status: DC | PRN
  Administered 2017-11-13 (×2): 1 mg via INTRAVENOUS

## 2017-11-13 MED ORDER — OXYCODONE HCL 5 MG PO TABS: 5.0000 mg | ORAL_TABLET | ORAL | Status: DC | PRN

## 2017-11-13 MED ORDER — MIDAZOLAM HCL 2 MG/2ML IJ SOLN
INTRAMUSCULAR | Status: AC
  Administered 2017-11-13: 2 mg
  Filled 2017-11-13: qty 2

## 2017-11-13 MED ORDER — METOCLOPRAMIDE HCL 5 MG PO TABS
5.0000 mg | ORAL_TABLET | Freq: Three times a day (TID) | ORAL | Status: DC | PRN
  Administered 2017-11-14: 10 mg via ORAL
  Filled 2017-11-13: qty 2

## 2017-11-13 MED ORDER — ONDANSETRON HCL 4 MG PO TABS: 4.0000 mg | ORAL_TABLET | Freq: Four times a day (QID) | ORAL | Status: DC | PRN

## 2017-11-13 MED ORDER — ACETAMINOPHEN 10 MG/ML IV SOLN
1000.0000 mg | Freq: Once | INTRAVENOUS | Status: AC
  Administered 2017-11-13: 1000 mg via INTRAVENOUS

## 2017-11-13 MED ORDER — CEFAZOLIN SODIUM-DEXTROSE 2-4 GM/100ML-% IV SOLN
2.0000 g | INTRAVENOUS | Status: AC
  Administered 2017-11-13: 2 g via INTRAVENOUS

## 2017-11-13 MED ORDER — CEFAZOLIN SODIUM-DEXTROSE 2-4 GM/100ML-% IV SOLN
2.0000 g | Freq: Four times a day (QID) | INTRAVENOUS | Status: AC
  Administered 2017-11-13 – 2017-11-14 (×2): 2 g via INTRAVENOUS
  Filled 2017-11-13 (×2): qty 100

## 2017-11-13 MED ORDER — BUPIVACAINE LIPOSOME 1.3 % IJ SUSP
20.0000 mL | Freq: Once | INTRAMUSCULAR | Status: DC
  Filled 2017-11-13: qty 20

## 2017-11-13 MED ORDER — MIDAZOLAM HCL 2 MG/2ML IJ SOLN
INTRAMUSCULAR | Status: AC
  Filled 2017-11-13: qty 2

## 2017-11-13 MED ORDER — ACETAMINOPHEN 500 MG PO TABS
1000.0000 mg | ORAL_TABLET | Freq: Four times a day (QID) | ORAL | Status: AC
  Administered 2017-11-13 – 2017-11-14 (×4): 1000 mg via ORAL
  Filled 2017-11-13 (×4): qty 2

## 2017-11-13 MED ORDER — PHENYLEPHRINE HCL 10 MG/ML IJ SOLN
INTRAMUSCULAR | Status: DC | PRN
  Administered 2017-11-13 (×2): 80 ug via INTRAVENOUS
  Administered 2017-11-13: 40 ug via INTRAVENOUS
  Administered 2017-11-13 (×3): 80 ug via INTRAVENOUS

## 2017-11-13 MED ORDER — GABAPENTIN 300 MG PO CAPS
ORAL_CAPSULE | ORAL | Status: AC
  Administered 2017-11-13: 300 mg via ORAL
  Filled 2017-11-13: qty 1

## 2017-11-13 MED ORDER — MORPHINE SULFATE (PF) 4 MG/ML IV SOLN: 1.0000 mg | INTRAVENOUS | Status: DC | PRN

## 2017-11-13 MED ORDER — SODIUM CHLORIDE 0.9 % IJ SOLN
INTRAMUSCULAR | Status: DC | PRN
  Administered 2017-11-13 (×2): 30 mL

## 2017-11-13 MED ORDER — FLEET ENEMA 7-19 GM/118ML RE ENEM: 1.0000 | ENEMA | Freq: Once | RECTAL | Status: DC | PRN

## 2017-11-13 SURGICAL SUPPLY — 49 items
BAG DECANTER FOR FLEXI CONT (MISCELLANEOUS) IMPLANT
BAG ZIPLOCK 12X15 (MISCELLANEOUS) ×3 IMPLANT
BANDAGE ACE 6X5 VEL STRL LF (GAUZE/BANDAGES/DRESSINGS) ×3 IMPLANT
BLADE SAG 18X100X1.27 (BLADE) ×3 IMPLANT
BLADE SAW SGTL 11.0X1.19X90.0M (BLADE) ×3 IMPLANT
BOWL SMART MIX CTS (DISPOSABLE) ×3 IMPLANT
CAPT KNEE TOTAL 3 ATTUNE ×3 IMPLANT
CEMENT HV SMART SET (Cement) ×6 IMPLANT
CLOSURE WOUND 1/2 X4 (GAUZE/BANDAGES/DRESSINGS) ×2
COVER SURGICAL LIGHT HANDLE (MISCELLANEOUS) ×3 IMPLANT
CUFF TOURN SGL QUICK 34 (TOURNIQUET CUFF) ×2
CUFF TRNQT CYL 34X4X40X1 (TOURNIQUET CUFF) ×1 IMPLANT
DECANTER SPIKE VIAL GLASS SM (MISCELLANEOUS) ×3 IMPLANT
DRAPE U-SHAPE 47X51 STRL (DRAPES) ×3 IMPLANT
DRSG ADAPTIC 3X8 NADH LF (GAUZE/BANDAGES/DRESSINGS) ×3 IMPLANT
DRSG PAD ABDOMINAL 8X10 ST (GAUZE/BANDAGES/DRESSINGS) ×3 IMPLANT
DURAPREP 26ML APPLICATOR (WOUND CARE) ×3 IMPLANT
ELECT REM PT RETURN 15FT ADLT (MISCELLANEOUS) ×3 IMPLANT
EVACUATOR 1/8 PVC DRAIN (DRAIN) ×3 IMPLANT
GAUZE SPONGE 4X4 12PLY STRL (GAUZE/BANDAGES/DRESSINGS) ×3 IMPLANT
GLOVE BIO SURGEON STRL SZ7.5 (GLOVE) ×3 IMPLANT
GLOVE BIO SURGEON STRL SZ8 (GLOVE) ×3 IMPLANT
GLOVE BIOGEL PI IND STRL 6.5 (GLOVE) IMPLANT
GLOVE BIOGEL PI IND STRL 8 (GLOVE) ×2 IMPLANT
GLOVE BIOGEL PI INDICATOR 6.5 (GLOVE)
GLOVE BIOGEL PI INDICATOR 8 (GLOVE) ×4
GLOVE SURG SS PI 6.5 STRL IVOR (GLOVE) IMPLANT
GOWN STRL REUS W/TWL LRG LVL3 (GOWN DISPOSABLE) ×3 IMPLANT
GOWN STRL REUS W/TWL XL LVL3 (GOWN DISPOSABLE) ×3 IMPLANT
HANDPIECE INTERPULSE COAX TIP (DISPOSABLE) ×2
IMMOBILIZER KNEE 20 (SOFTGOODS) ×3
IMMOBILIZER KNEE 20 THIGH 36 (SOFTGOODS) ×1 IMPLANT
MANIFOLD NEPTUNE II (INSTRUMENTS) ×3 IMPLANT
NS IRRIG 1000ML POUR BTL (IV SOLUTION) ×3 IMPLANT
PACK TOTAL KNEE CUSTOM (KITS) ×3 IMPLANT
PADDING CAST COTTON 6X4 STRL (CAST SUPPLIES) ×6 IMPLANT
POSITIONER SURGICAL ARM (MISCELLANEOUS) ×3 IMPLANT
SET HNDPC FAN SPRY TIP SCT (DISPOSABLE) ×1 IMPLANT
STRIP CLOSURE SKIN 1/2X4 (GAUZE/BANDAGES/DRESSINGS) ×4 IMPLANT
SUT MNCRL AB 4-0 PS2 18 (SUTURE) ×3 IMPLANT
SUT STRATAFIX 0 PDS 27 VIOLET (SUTURE) ×3
SUT VIC AB 2-0 CT1 27 (SUTURE) ×6
SUT VIC AB 2-0 CT1 TAPERPNT 27 (SUTURE) ×3 IMPLANT
SUTURE STRATFX 0 PDS 27 VIOLET (SUTURE) ×1 IMPLANT
SYR 30ML LL (SYRINGE) ×6 IMPLANT
TRAY FOLEY W/METER SILVER 16FR (SET/KITS/TRAYS/PACK) ×3 IMPLANT
WATER STERILE IRR 1000ML POUR (IV SOLUTION) ×6 IMPLANT
WRAP KNEE MAXI GEL POST OP (GAUZE/BANDAGES/DRESSINGS) ×3 IMPLANT
YANKAUER SUCT BULB TIP 10FT TU (MISCELLANEOUS) ×3 IMPLANT

## 2017-11-13 NOTE — Transfer of Care (Signed)
Immediate Anesthesia Transfer of Care Note  Patient: Amado NashMichael W Savarino  Procedure(s) Performed: LEFT TOTAL KNEE ARTHROPLASTY (Left Knee)  Patient Location: PACU  Anesthesia Type:Spinal  Level of Consciousness: drowsy and patient cooperative  Airway & Oxygen Therapy: Patient Spontanous Breathing and Patient connected to face mask oxygen  Post-op Assessment: Report given to RN and Post -op Vital signs reviewed and stable  Post vital signs: Reviewed and stable  Last Vitals:  Vitals:   11/13/17 1619 11/13/17 1620  BP:    Pulse: 73 76  Resp: 17   Temp:    SpO2: 100% 98%    Last Pain:  Vitals:   11/13/17 1543  TempSrc: Oral      Patients Stated Pain Goal: 0 (11/13/17 1545)  Complications: No apparent anesthesia complications

## 2017-11-13 NOTE — Progress Notes (Signed)
Assisted Dr. Hatchett with left, ultrasound guided, adductor canal block. Side rails up, monitors on throughout procedure. See vital signs in flow sheet. Tolerated Procedure well. 

## 2017-11-13 NOTE — Op Note (Signed)
OPERATIVE REPORT-TOTAL KNEE ARTHROPLASTY   Pre-operative diagnosis- Osteoarthritis  Left knee(s)  Post-operative diagnosis- Osteoarthritis Left knee(s)  Procedure-  Left  Total Knee Arthroplasty  Surgeon- Nicholas RankinFrank V. Vinicio Lynk, MD  Assistant- [name] Peacerew Perkins, PA-C   Anesthesia-  Adductor canal block and spinal  EBL- 25 ml   Drains Hemovac  Tourniquet time-  Total Tourniquet Time Documented: Thigh (Left) - 42 minutes Total: Thigh (Left) - 42 minutes     Complications- None  Condition-PACU - hemodynamically stable.   Brief Clinical Note   Nicholas Nash is a 46 y.o. year old male with end stage OA of his left knee with progressively worsening pain and dysfunction. He has constant pain, with activity and at rest and significant functional deficits with difficulties even with ADLs. He has had extensive non-op management including analgesics, injections of cortisone and viscosupplements, and home exercise program, but remains in significant pain with significant dysfunction. Radiographs show bone on bone arthritis medial and patellofemoral. He presents now for left Total Knee Arthroplasty.     Procedure in detail---   The patient is brought into the operating room and positioned supine on the operating table. After successful administration of  Adductor canal block and spinal,   a tourniquet is placed high on the  Left thigh(s) and the lower extremity is prepped and draped in the usual sterile fashion. Time out is performed by the operating team and then the  Left lower extremity is wrapped in Esmarch, knee flexed and the tourniquet inflated to 300 mmHg.       A midline incision is made with a ten blade through the subcutaneous tissue to the level of the extensor mechanism. A fresh blade is used to make a medial parapatellar arthrotomy. Soft tissue over the proximal medial tibia is subperiosteally elevated to the joint line with a knife and into the semimembranosus bursa with a Cobb  elevator. Soft tissue over the proximal lateral tibia is elevated with attention being paid to avoiding the patellar tendon on the tibial tubercle. The patella is everted, knee flexed 90 degrees and the ACL and PCL are removed. Findings are bone on bone medial and patellofemoral with large global osteophytes.        The drill is used to create a starting hole in the distal femur and the canal is thoroughly irrigated with sterile saline to remove the fatty contents. The 5 degree Left  valgus alignment guide is placed into the femoral canal and the distal femoral cutting block is pinned to remove 9 mm off the distal femur. Resection is made with an oscillating saw.      The tibia is subluxed forward and the menisci are removed. The extramedullary alignment guide is placed referencing proximally at the medial aspect of the tibial tubercle and distally along the second metatarsal axis and tibial crest. The block is pinned to remove 2mm off the more deficient medial  side. Resection is made with an oscillating saw. Size 9is the most appropriate size for the tibia and the proximal tibia is prepared with the modular drill and keel punch for that size.      The femoral sizing guide is placed and size 8 is most appropriate. Rotation is marked off the epicondylar axis and confirmed by creating a rectangular flexion gap at 90 degrees. The size 8 cutting block is pinned in this rotation and the anterior, posterior and chamfer cuts are made with the oscillating saw. The intercondylar block is then placed and that cut  is made.      Trial size 9 tibial component, trial size 8 posterior stabilized femur and a 10  mm posterior stabilized rotating platform insert trial is placed. Full extension is achieved with excellent varus/valgus and anterior/posterior balance throughout full range of motion. The patella is everted and thickness measured to be 27  mm. Free hand resection is taken to 15 mm, a 41 template is placed, lug holes  are drilled, trial patella is placed, and it tracks normally. Osteophytes are removed off the posterior femur with the trial in place. All trials are removed and the cut bone surfaces prepared with pulsatile lavage. Cement is mixed and once ready for implantation, the size 9 tibial implant, size  8 posterior stabilized femoral component, and the size 41 patella are cemented in place and the patella is held with the clamp. The trial insert is placed and the knee held in full extension. The Exparel (20 ml mixed with 60 ml saline) is injected into the extensor mechanism, posterior capsule, medial and lateral gutters and subcutaneous tissues.  All extruded cement is removed and once the cement is hard the permanent 10 mm posterior stabilized rotating platform insert is placed into the tibial tray.      The wound is copiously irrigated with saline solution and the extensor mechanism closed over a hemovac drain with #1 V-loc suture. The tourniquet is released for a total tourniquet time of 42  minutes. Flexion against gravity is 140 degrees and the patella tracks normally. Subcutaneous tissue is closed with 2.0 vicryl and subcuticular with running 4.0 Monocryl. The incision is cleaned and dried and steri-strips and a bulky sterile dressing are applied. The limb is placed into a knee immobilizer and the patient is awakened and transported to recovery in stable condition.      Please note that a surgical assistant was a medical necessity for this procedure in order to perform it in a safe and expeditious manner. Surgical assistant was necessary to retract the ligaments and vital neurovascular structures to prevent injury to them and also necessary for proper positioning of the limb to allow for anatomic placement of the prosthesis.   Nicholas RankinFrank V. Azizi Bally, MD    11/13/2017, 5:50 PM

## 2017-11-13 NOTE — Discharge Instructions (Addendum)
° °Dr. Frank Aluisio °Total Joint Specialist °[facility] °[address] °[place], [place] [address] °[phone] ° °TOTAL KNEE REPLACEMENT POSTOPERATIVE DIRECTIONS ° °Knee Rehabilitation, Guidelines Following Surgery  °Results after knee surgery are often greatly improved when you follow the exercise, range of motion and muscle strengthening exercises prescribed by your doctor. Safety measures are also important to protect the knee from further injury. Any time any of these exercises cause you to have increased pain or swelling in your knee joint, decrease the amount until you are comfortable again and slowly increase them. If you have problems or questions, call your caregiver or physical therapist for advice.  ° °HOME CARE INSTRUCTIONS  °Remove items at home which could result in a fall. This includes throw rugs or furniture in walking pathways.  °· ICE to the affected knee every three hours for 30 minutes at a time and then as needed for pain and swelling.  Continue to use ice on the knee for pain and swelling from surgery. You may notice swelling that will progress down to the foot and ankle.  This is normal after surgery.  Elevate the leg when you are not up walking on it.   °· Continue to use the breathing machine which will help keep your temperature down.  It is common for your temperature to cycle up and down following surgery, especially at night when you are not up moving around and exerting yourself.  The breathing machine keeps your lungs expanded and your temperature down. °· Do not place pillow under knee, focus on keeping the knee straight while resting ° °DIET °You may resume your previous home diet once your are discharged from the hospital. ° °DRESSING / WOUND CARE / SHOWERING °You may shower 3 days after surgery, but keep the wounds dry during showering.  You may use an occlusive plastic wrap (Press'n Seal for example), NO SOAKING/SUBMERGING IN THE BATHTUB.  If the  bandage gets wet, change with a clean dry gauze.  If the incision gets wet, pat the wound dry with a clean towel. °You may start showering once you are discharged home but do not submerge the incision under water. Just pat the incision dry and apply a dry gauze dressing on daily. °Change the surgical dressing daily and reapply a dry dressing each time. ° °ACTIVITY °Walk with your walker as instructed. °Use walker as long as suggested by your caregivers. °Avoid periods of inactivity such as sitting longer than an hour when not asleep. This helps prevent blood clots.  °You may resume a sexual relationship in one month or when given the OK by your doctor.  °You may return to work once you are cleared by your doctor.  °Do not drive a car for 6 weeks or until released by you surgeon.  °Do not drive while taking narcotics. ° °WEIGHT BEARING °Weight bearing as tolerated with assist device (walker, cane, etc) as directed, use it as long as suggested by your surgeon or therapist, typically at least 4-6 weeks. ° °POSTOPERATIVE CONSTIPATION PROTOCOL °Constipation - defined medically as fewer than three stools per week and severe constipation as less than one stool per week. ° °One of the most common issues patients have following surgery is constipation.  Even if you have a regular bowel pattern at home, your normal regimen is likely to be disrupted due to multiple reasons following surgery.  Combination of anesthesia, postoperative narcotics, change in appetite and fluid intake all can affect your bowels.    In order to avoid complications following surgery, here are some recommendations in order to help you during your recovery period. ° °Colace (docusate) - Pick up an over-the-counter form of Colace or another stool softener and take twice a day as long as you are requiring postoperative pain medications.  Take with a full glass of water daily.  If you experience loose stools or diarrhea, hold the colace until you stool forms  back up.  If your symptoms do not get better within 1 week or if they get worse, check with your doctor. ° °Dulcolax (bisacodyl) - Pick up over-the-counter and take as directed by the product packaging as needed to assist with the movement of your bowels.  Take with a full glass of water.  Use this product as needed if not relieved by Colace only.  ° °MiraLax (polyethylene glycol) - Pick up over-the-counter to have on hand.  MiraLax is a solution that will increase the amount of water in your bowels to assist with bowel movements.  Take as directed and can mix with a glass of water, juice, soda, coffee, or tea.  Take if you go more than two days without a movement. °Do not use MiraLax more than once per day. Call your doctor if you are still constipated or irregular after using this medication for 7 days in a row. ° °If you continue to have problems with postoperative constipation, please contact the office for further assistance and recommendations.  If you experience "the worst abdominal pain ever" or develop nausea or vomiting, please contact the office immediatly for further recommendations for treatment. ° °ITCHING ° If you experience itching with your medications, try taking only a single pain pill, or even half a pain pill at a time.  You can also use Benadryl over the counter for itching or also to help with sleep.  ° °TED HOSE STOCKINGS °Wear the elastic stockings on both legs for three weeks following surgery during the day but you may remove then at night for sleeping. ° °MEDICATIONS °See your medication summary on the “After Visit Summary” that the nursing staff will review with you prior to discharge.  You may have some home medications which will be placed on hold until you complete the course of blood thinner medication.  It is important for you to complete the blood thinner medication as prescribed by your surgeon.  Continue your approved medications as instructed at time of  discharge. ° °PRECAUTIONS °If you experience chest pain or shortness of breath - call 911 immediately for transfer to the hospital emergency department.  °If you develop a fever greater that 101 F, purulent drainage from wound, increased redness or drainage from wound, foul odor from the wound/dressing, or calf pain - CONTACT YOUR SURGEON.   °                                                °FOLLOW-UP APPOINTMENTS °Make sure you keep all of your appointments after your operation with your surgeon and caregivers. You should call the office at the above phone number and make an appointment for approximately two weeks after the date of your surgery or on the date instructed by your surgeon outlined in the "After Visit Summary". ° ° °RANGE OF MOTION AND STRENGTHENING EXERCISES  °Rehabilitation of the knee is important following a knee injury or   an operation. After just a few days of immobilization, the muscles of the thigh which control the knee become weakened and shrink (atrophy). Knee exercises are designed to build up the tone and strength of the thigh muscles and to improve knee motion. Often times heat used for twenty to thirty minutes before working out will loosen up your tissues and help with improving the range of motion but do not use heat for the first two weeks following surgery. These exercises can be done on a training (exercise) mat, on the floor, on a table or on a bed. Use what ever works the best and is most comfortable for you Knee exercises include:  °Leg Lifts - While your knee is still immobilized in a splint or cast, you can do straight leg raises. Lift the leg to 60 degrees, hold for 3 sec, and slowly lower the leg. Repeat 10-20 times 2-3 times daily. Perform this exercise against resistance later as your knee gets better.  °Quad and Hamstring Sets - Tighten up the muscle on the front of the thigh (Quad) and hold for 5-10 sec. Repeat this 10-20 times hourly. Hamstring sets are done by pushing the  foot backward against an object and holding for 5-10 sec. Repeat as with quad sets.  °· Leg Slides: Lying on your back, slowly slide your foot toward your buttocks, bending your knee up off the floor (only go as far as is comfortable). Then slowly slide your foot back down until your leg is flat on the floor again. °· Angel Wings: Lying on your back spread your legs to the side as far apart as you can without causing discomfort.  °A rehabilitation program following serious knee injuries can speed recovery and prevent re-injury in the future due to weakened muscles. Contact your doctor or a physical therapist for more information on knee rehabilitation.  ° °IF YOU ARE TRANSFERRED TO A SKILLED REHAB FACILITY °If the patient is transferred to a skilled rehab facility following release from the hospital, a list of the current medications will be sent to the facility for the patient to continue.  When discharged from the skilled rehab facility, please have the facility set up the patient's Home Health Physical Therapy prior to being released. Also, the skilled facility will be responsible for providing the patient with their medications at time of release from the facility to include their pain medication, the muscle relaxants, and their blood thinner medication. If the patient is still at the rehab facility at time of the two week follow up appointment, the skilled rehab facility will also need to assist the patient in arranging follow up appointment in our office and any transportation needs. ° °MAKE SURE YOU:  °Understand these instructions.  °Get help right away if you are not doing well or get worse.  ° ° °Pick up stool softner and laxative for home use following surgery while on pain medications. °Do not submerge incision under water. °Please use good hand washing techniques while changing dressing each day. °May shower starting three days after surgery. °Please use a clean towel to pat the incision dry following  showers. °Continue to use ice for pain and swelling after surgery. °Do not use any lotions or creams on the incision until instructed by your surgeon. ° °Take Xarelto for two and a half more weeks following discharge from the hospital, then discontinue Xarelto. °Once the patient has completed the blood thinner regimen, then take a Baby 81 mg Aspirin daily for three   more weeks. ° ° ° °Information on my medicine - XARELTO® (Rivaroxaban) ° °This medication education was reviewed with me or my healthcare representative as part of my discharge preparation.   ° °Why was Xarelto® prescribed for you? °Xarelto® was prescribed for you to reduce the risk of blood clots forming after orthopedic surgery. The medical term for these abnormal blood clots is venous thromboembolism (VTE). ° °What do you need to know about xarelto® ? °Take your Xarelto® ONCE DAILY at the same time every day. °You may take it either with or without food. ° °If you have difficulty swallowing the tablet whole, you may crush it and mix in applesauce just prior to taking your dose. ° °Take Xarelto® exactly as prescribed by your doctor and DO NOT stop taking Xarelto® without talking to the doctor who prescribed the medication.  Stopping without other VTE prevention medication to take the place of Xarelto® may increase your risk of developing a clot. ° °After discharge, you should have regular check-up appointments with your healthcare provider that is prescribing your Xarelto®.   ° °What do you do if you miss a dose? °If you miss a dose, take it as soon as you remember on the same day then continue your regularly scheduled once daily regimen the next day. Do not take two doses of Xarelto® on the same day.  ° °Important Safety Information °A possible side effect of Xarelto® is bleeding. You should call your healthcare provider right away if you experience any of the following: °? Bleeding from an injury or your nose that does not stop. °? Unusual colored  urine (red or dark brown) or unusual colored stools (red or black). °? Unusual bruising for unknown reasons. °? A serious fall or if you hit your head (even if there is no bleeding). ° °Some medicines may interact with Xarelto® and might increase your risk of bleeding while on Xarelto®. To help avoid this, consult your healthcare provider or pharmacist prior to using any new prescription or non-prescription medications, including herbals, vitamins, non-steroidal anti-inflammatory drugs (NSAIDs) and supplements. ° °This website has more information on Xarelto®: www.xarelto.com. ° ° °

## 2017-11-13 NOTE — Interval H&P Note (Signed)
History and Physical Interval Note:  11/13/2017 4:06 PM  Nicholas NashMichael W Einstein  has presented today for surgery, with the diagnosis of left knee osteoarthritis  The various methods of treatment have been discussed with the patient and family. After consideration of risks, benefits and other options for treatment, the patient has consented to  Procedure(s): LEFT TOTAL KNEE ARTHROPLASTY (Left) as a surgical intervention .  The patient's history has been reviewed, patient examined, no change in status, stable for surgery.  I have reviewed the patient's chart and labs.  Questions were answered to the patient's satisfaction.     Homero FellersFrank Jhovani Griswold

## 2017-11-13 NOTE — Anesthesia Procedure Notes (Addendum)
Anesthesia Regional Block: Adductor canal block   Pre-Anesthetic Checklist: ,, timeout performed, Correct Patient, Correct Site, Correct Laterality, Correct Procedure, Correct Position, site marked, Risks and benefits discussed,  Surgical consent,  Pre-op evaluation,  At surgeon's request and post-op pain management  Laterality: Left and Lower  Prep: chloraprep       Needles:  Injection technique: Single-shot  Needle Type: Echogenic Stimulator Needle     Needle Length: 9cm  Needle Gauge: 21   Needle insertion depth: 4 cm   Additional Needles:   Procedures:,,,, ultrasound used (permanent image in chart),,,,  Narrative:  Start time: 11/13/2017 4:04 PM End time: 11/13/2017 4:14 PM Injection made incrementally with aspirations every 5 mL.

## 2017-11-13 NOTE — Anesthesia Procedure Notes (Signed)
Spinal  Patient location during procedure: OR Start time: 11/13/2017 4:30 PM End time: 11/13/2017 4:36 PM Staffing Anesthesiologist: Lyn Hollingshead, MD Resident/CRNA: Glory Buff, CRNA Performed: resident/CRNA  Preanesthetic Checklist Completed: patient identified, site marked, surgical consent, pre-op evaluation, timeout performed, IV checked, risks and benefits discussed and monitors and equipment checked Spinal Block Patient position: sitting Prep: ChloraPrep Patient monitoring: heart rate, continuous pulse ox and blood pressure Approach: midline Location: L3-4 Injection technique: single-shot Needle Needle type: Pencan  Needle gauge: 24 G Needle length: 9 cm Needle insertion depth: 7 cm Assessment Sensory level: T6 Additional Notes Kit expiration date checked and verified,back tatoo avoided, -heme, -paraesthesia, +CSF pre and post injection, patient tolerated well.

## 2017-11-13 NOTE — Anesthesia Preprocedure Evaluation (Signed)
Anesthesia Evaluation  Patient identified by MRN, date of birth, ID band Patient awake    Reviewed: Allergy & Precautions, NPO status   Airway Mallampati: I       Dental no notable dental hx. (+) Teeth Intact   Pulmonary    Pulmonary exam normal breath sounds clear to auscultation       Cardiovascular negative cardio ROS Normal cardiovascular exam Rhythm:Regular Rate:Normal     Neuro/Psych negative neurological ROS     GI/Hepatic   Endo/Other    Renal/GU   negative genitourinary   Musculoskeletal   Abdominal (+) + obese,   Peds  Hematology negative hematology ROS (+)   Anesthesia Other Findings   Reproductive/Obstetrics                             Anesthesia Physical Anesthesia Plan  ASA: II  Anesthesia Plan: Spinal   Post-op Pain Management:  Regional for Post-op pain   Induction:   PONV Risk Score and Plan: 1  Airway Management Planned: Natural Airway, Nasal Cannula and Simple Face Mask  Additional Equipment:   Intra-op Plan:   Post-operative Plan:   Informed Consent: I have reviewed the patients History and Physical, chart, labs and discussed the procedure including the risks, benefits and alternatives for the proposed anesthesia with the patient or authorized representative who has indicated his/her understanding and acceptance.     Plan Discussed with: CRNA and Surgeon  Anesthesia Plan Comments:         Anesthesia Quick Evaluation

## 2017-11-14 ENCOUNTER — Encounter (HOSPITAL_COMMUNITY): Payer: Self-pay | Admitting: Orthopedic Surgery

## 2017-11-14 LAB — CBC
HEMATOCRIT: 41.2 % (ref 39.0–52.0)
Hemoglobin: 13.7 g/dL (ref 13.0–17.0)
MCH: 29.3 pg (ref 26.0–34.0)
MCHC: 33.3 g/dL (ref 30.0–36.0)
MCV: 88.2 fL (ref 78.0–100.0)
Platelets: 265 10*3/uL (ref 150–400)
RBC: 4.67 MIL/uL (ref 4.22–5.81)
RDW: 13.2 % (ref 11.5–15.5)
WBC: 16.8 10*3/uL — ABNORMAL HIGH (ref 4.0–10.5)

## 2017-11-14 LAB — BASIC METABOLIC PANEL
Anion gap: 7 (ref 5–15)
BUN: 17 mg/dL (ref 6–20)
CALCIUM: 8.9 mg/dL (ref 8.9–10.3)
CO2: 27 mmol/L (ref 22–32)
CREATININE: 1.2 mg/dL (ref 0.61–1.24)
Chloride: 105 mmol/L (ref 101–111)
GFR calc non Af Amer: >60 mL/min (ref >60)
Glucose, Bld: 132 mg/dL — ABNORMAL HIGH (ref 65–99)
Potassium: 4.3 mmol/L (ref 3.5–5.1)
Sodium: 139 mmol/L (ref 135–145)

## 2017-11-14 LAB — TYPE AND SCREEN
ABO/RH(D): A NEG
Antibody Screen: NEGATIVE

## 2017-11-14 MED ORDER — OXYCODONE HCL 5 MG PO TABS: 5.0000 mg | ORAL_TABLET | ORAL | 0 refills | Status: DC | PRN

## 2017-11-14 MED ORDER — RIVAROXABAN 10 MG PO TABS
10.0000 mg | ORAL_TABLET | Freq: Every day | ORAL | 0 refills | Status: DC
Start: 2017-11-15 — End: 2017-11-15

## 2017-11-14 MED ORDER — CHLORPROMAZINE HCL 10 MG PO TABS
10.0000 mg | ORAL_TABLET | Freq: Once | ORAL | Status: AC
  Administered 2017-11-14: 16:00:00 10 mg via ORAL
  Filled 2017-11-14: qty 1

## 2017-11-14 NOTE — Evaluation (Signed)
Physical Therapy Evaluation Patient Details Name: Nicholas NashMichael W Nash MRN: [id number] DOB: 03-Apr-1971 Today's Date: 11/14/2017   History of Present Illness  LEFT TOTAL KNEE ARTHROPLASTY (Left)  Clinical Impression  The patient ambulated without KI today. Minimal pain this visit. Pt admitted with above diagnosis. Pt currently with functional limitations due to the deficits listed below (see PT Problem List).  Pt will benefit from skilled PT to increase their independence and safety with mobility to allow discharge to the venue listed below.       Follow Up Recommendations Outpatient PT;DC plan and follow up therapy as arranged by surgeon    Equipment Recommendations  None recommended by PT    Recommendations for Other Services       Precautions / Restrictions Precautions Precautions: Knee Required Braces or Orthoses: Knee Immobilizer - Right Knee Immobilizer - Right: Discontinue once straight leg raise with < 10 degree lag Restrictions Weight Bearing Restrictions: Yes      Mobility  Bed Mobility Overal bed mobility: Needs Assistance Bed Mobility: Supine to Sit     Supine to sit: Supervision     General bed mobility comments: no assist  Transfers Overall transfer level: Needs assistance Equipment used: Rolling walker (2 wheeled) Transfers: Sit to/from Stand Sit to Stand: Min guard Stand pivot transfers: Supervision       General transfer comment: VC for safety, hand placement and LE placement, patient is impulsive  Ambulation/Gait Ambulation/Gait assistance: Min assist Ambulation Distance (Feet): 200 Feet Assistive device: Rolling walker (2 wheeled) Gait Pattern/deviations: Step-to pattern;Antalgic     General Gait Details: cues for sequence and posture inside RW.  Stairs            Wheelchair Mobility    Modified Rankin (Stroke Patients Only)       Balance                                             Pertinent Vitals/Pain  Pain Assessment: 0-10 Pain Score: 3  Pain Location: left knee Pain Descriptors / Indicators: Sore Pain Intervention(s): Monitored during session;Premedicated before session;Repositioned    Home Living Family/patient expects to be discharged to:: Private residence Living Arrangements: Spouse/significant other Available Help at Discharge: Family Type of Home: House Home Access: Stairs to enter Entrance Stairs-Rails: LawyerLeft;Right Entrance Stairs-Number of Steps: 3 Home Layout: One level Home Equipment: Environmental consultantWalker - 2 wheels;Cane - single point Additional Comments: Pt reports wife is a Engineer, civil (consulting)nurse and will A as needed    Prior Function Level of Independence: Independent               Hand Dominance        Extremity/Trunk Assessment   Upper Extremity Assessment Upper Extremity Assessment: Overall WFL for tasks assessed    Lower Extremity Assessment Lower Extremity Assessment: LLE deficits/detail RLE Deficits / Details: able to perform SLR, knee flexion 10-50    Cervical / Trunk Assessment Cervical / Trunk Assessment: Normal  Communication   Communication: No difficulties  Cognition Arousal/Alertness: Awake/alert Behavior During Therapy: Impulsive Overall Cognitive Status: Within Functional Limits for tasks assessed                                        General Comments      Exercises Total Joint Exercises Ankle  Circles/Pumps: AROM;Both;10 reps Quad Sets: AROM;Both;10 reps Short Arc Quad: AROM;Left;15 reps Heel Slides: AAROM;Left;15 reps Hip ABduction/ADduction: AAROM;Left Straight Leg Raises: AAROM;Left;15 reps   Assessment/Plan    PT Assessment Patient needs continued PT services  PT Problem List Decreased strength;Decreased range of motion;Decreased activity tolerance;Decreased mobility;Decreased knowledge of precautions;Decreased safety awareness;Decreased knowledge of use of DME;Pain       PT Treatment Interventions DME instruction;Gait  training;Stair training;Functional mobility training;Therapeutic activities;Therapeutic exercise;Patient/family education    PT Goals (Current goals can be found in the Care Plan section)  Acute Rehab PT Goals Patient Stated Goal: home tomorrow. PT Goal Formulation: With patient Time For Goal Achievement: 11/16/17 Potential to Achieve Goals: Good    Frequency 7X/week   Barriers to discharge        Co-evaluation               AM-PAC PT "6 Clicks" Daily Activity  Outcome Measure Difficulty turning over in bed (including adjusting bedclothes, sheets and blankets)?: None Difficulty moving from lying on back to sitting on the side of the bed? : None Difficulty sitting down on and standing up from a chair with arms (e.g., wheelchair, bedside commode, etc,.)?: A Little Help needed moving to and from a bed to chair (including a wheelchair)?: A Little Help needed walking in hospital room?: A Little Help needed climbing 3-5 steps with a railing? : A Little 6 Click Score: 20    End of Session   Activity Tolerance: Patient tolerated treatment well Patient left: (up with OT) Nurse Communication: Mobility status PT Visit Diagnosis: Difficulty in walking, not elsewhere classified (R26.2);Pain Pain - Right/Left: Left Pain - part of body: Knee    Time: [id number] PT Time Calculation (min) (ACUTE ONLY): 18 min   Charges:   PT Evaluation $PT Eval Low Complexity: 1 Low     PT G Codes:        {Nicholas Nash PT [phone]   Rada HayHill, Nicholas Nash 11/14/2017, 11:02 AM

## 2017-11-14 NOTE — Progress Notes (Signed)
Physical Therapy Treatment Patient Details Name: Nicholas NashMichael W Nash MRN: [id number] DOB: 1971/09/06 Today's Date: 11/14/2017    History of Present Illness LEFT TOTAL KNEE ARTHROPLASTY (Left)    PT Comments    The patient is progressing well. Plans DC tomorrow.  Follow Up Recommendations  Outpatient PT;DC plan and follow up therapy as arranged by surgeon     Equipment Recommendations  None recommended by PT    Recommendations for Other Services       Precautions / Restrictions Precautions Precautions: Knee Precaution Comments: did not use KI Required Braces or Orthoses: Knee Immobilizer - Right Knee Immobilizer - Right: Discontinue once straight leg raise with < 10 degree lag Restrictions Weight Bearing Restrictions: Yes    Mobility  Bed Mobility Overal bed mobility: Independent Bed Mobility: Supine to Sit;Sit to Supine     Supine to sit: Supervision     General bed mobility comments: no assist  Transfers Overall transfer level: Needs assistance Equipment used: Rolling walker (2 wheeled) Transfers: Sit to/from Stand Sit to Stand: Supervision Stand pivot transfers: Supervision       General transfer comment: VC for safety, hand placement and LE placement, patient is impulsive  Ambulation/Gait Ambulation/Gait assistance: Min guard Ambulation Distance (Feet): 100 Feet Assistive device: Rolling walker (2 wheeled) Gait Pattern/deviations: Step-to pattern;Antalgic     General Gait Details: cues for sequence and posture inside RW.   Stairs            Wheelchair Mobility    Modified Rankin (Stroke Patients Only)       Balance                                            Cognition Arousal/Alertness: Awake/alert Behavior During Therapy: Impulsive Overall Cognitive Status: Within Functional Limits for tasks assessed                                        Exercises Total Joint Exercises Ankle Circles/Pumps:  AROM;Both;10 reps Quad Sets: AROM;Both;10 reps Short Arc Quad: AROM;Left;15 reps Heel Slides: AAROM;Left;15 reps Hip ABduction/ADduction: AAROM;Left Straight Leg Raises: AAROM;Left;10 reps Long Arc Quad: AROM Knee Flexion: AROM    General Comments        Pertinent Vitals/Pain Pain Assessment: 0-10 Pain Score: 3  Pain Location: left knee Pain Descriptors / Indicators: Sore Pain Intervention(s): Premedicated before session;Monitored during session;Ice applied    Home Living Family/patient expects to be discharged to:: Private residence Living Arrangements: Spouse/significant other Available Help at Discharge: Family Type of Home: House Home Access: Stairs to enter Entrance Stairs-Rails: Left;Right Home Layout: One level Home Equipment: Environmental consultantWalker - 2 wheels;Cane - single point Additional Comments: Pt reports wife is a Engineer, civil (consulting)nurse and will A as needed    Prior Function Level of Independence: Independent          PT Goals (current goals can now be found in the care plan section) Acute Rehab PT Goals Patient Stated Goal: home tomorrow. PT Goal Formulation: With patient Time For Goal Achievement: 11/16/17 Potential to Achieve Goals: Good Progress towards PT goals: Progressing toward goals    Frequency    7X/week      PT Plan Current plan remains appropriate    Co-evaluation  AM-PAC PT "6 Clicks" Daily Activity  Outcome Measure  Difficulty turning over in bed (including adjusting bedclothes, sheets and blankets)?: None Difficulty moving from lying on back to sitting on the side of the bed? : None Difficulty sitting down on and standing up from a chair with arms (e.g., wheelchair, bedside commode, etc,.)?: A Little Help needed moving to and from a bed to chair (including a wheelchair)?: A Little Help needed walking in hospital room?: A Little Help needed climbing 3-5 steps with a railing? : A Little 6 Click Score: 20    End of Session   Activity  Tolerance: Patient tolerated treatment well Patient left: in bed;with call bell/phone within reach Nurse Communication: Mobility status PT Visit Diagnosis: Difficulty in walking, not elsewhere classified (R26.2);Pain Pain - Right/Left: Left Pain - part of body: Knee     Time: 1340-1402 PT Time Calculation (min) (ACUTE ONLY): 22 min  Charges:  $Gait Training: 8-22 mins                    G CodesBlanchard Nash:       Nicholas Nash PT [phone]     Nicholas Nash, Nicholas Nash [name] 11/14/2017, 2:24 PM

## 2017-11-14 NOTE — Discharge Summary (Signed)
Physician Discharge Summary   Patient ID: Nicholas Nash MRN: [id number] DOB/AGE: 1971-03-09 46 y.o.  Admit date: 11/13/2017 Discharge date: 11-15-2017  Primary Diagnosis:  Osteoarthritis Left knee(s)   Admission Diagnoses:  Past Medical History:  Diagnosis Date  . Anxiety    PTSD  . Arthritis   . Chronic kidney disease   . Depression   . GERD (gastroesophageal reflux disease)   . Sleep apnea    Discharge Diagnoses:   Principal Problem:   OA (osteoarthritis) of knee  Estimated body mass index is 32.37 kg/m as calculated from the following:   Height as of this encounter: 6' 3"  (1.905 m).   Weight as of this encounter: 117.5 kg (259 lb).  Procedure:  Procedure(s) (LRB): LEFT TOTAL KNEE ARTHROPLASTY (Left)   Consults: None  HPI: Nicholas Nash is a 46 y.o. year old male with end stage OA of his left knee with progressively worsening pain and dysfunction. He has constant pain, with activity and at rest and significant functional deficits with difficulties even with ADLs. He has had extensive non-op management including analgesics, injections of cortisone and viscosupplements, and home exercise program, but remains in significant pain with significant dysfunction. Radiographs show bone on bone arthritis medial and patellofemoral. He presents now for left Total Knee Arthroplasty.    Laboratory Data: Admission on 11/13/2017  Component Date Value Ref Range Status  . WBC 11/14/2017 16.8* 4.0 - 10.5 K/uL Final  . RBC 11/14/2017 4.67  4.22 - 5.81 MIL/uL Final  . Hemoglobin 11/14/2017 13.7  13.0 - 17.0 g/dL Final  . HCT 11/14/2017 41.2  39.0 - 52.0 % Final  . MCV 11/14/2017 88.2  78.0 - 100.0 fL Final  . MCH 11/14/2017 29.3  26.0 - 34.0 pg Final  . MCHC 11/14/2017 33.3  30.0 - 36.0 g/dL Final  . RDW 11/14/2017 13.2  11.5 - 15.5 % Final  . Platelets 11/14/2017 265  150 - 400 K/uL Final  . Sodium 11/14/2017 139  135 - 145 mmol/L Final  . Potassium 11/14/2017 4.3  3.5 - 5.1  mmol/L Final  . Chloride 11/14/2017 105  101 - 111 mmol/L Final  . CO2 11/14/2017 27  22 - 32 mmol/L Final  . Glucose, Bld 11/14/2017 132* 65 - 99 mg/dL Final  . BUN 11/14/2017 17  6 - 20 mg/dL Final  . Creatinine, Ser 11/14/2017 1.20  0.61 - 1.24 mg/dL Final  . Calcium 11/14/2017 8.9  8.9 - 10.3 mg/dL Final  . GFR calc non Af Amer 11/14/2017 >60  >60 mL/min Final  . GFR calc Af Amer 11/14/2017 >60  >60 mL/min Final   Comment: (NOTE) The eGFR has been calculated using the CKD EPI equation. This calculation has not been validated in all clinical situations. eGFR's persistently <60 mL/min signify possible Chronic Kidney Disease.   . Anion gap 11/14/2017 7  5 - 15 Final  . WBC 11/15/2017 15.3* 4.0 - 10.5 K/uL Final  . RBC 11/15/2017 4.12* 4.22 - 5.81 MIL/uL Final  . Hemoglobin 11/15/2017 12.1* 13.0 - 17.0 g/dL Final  . HCT 11/15/2017 36.4* 39.0 - 52.0 % Final  . MCV 11/15/2017 88.3  78.0 - 100.0 fL Final  . MCH 11/15/2017 29.4  26.0 - 34.0 pg Final  . MCHC 11/15/2017 33.2  30.0 - 36.0 g/dL Final  . RDW 11/15/2017 13.4  11.5 - 15.5 % Final  . Platelets 11/15/2017 237  150 - 400 K/uL Final  . Sodium 11/15/2017 140  135 - 145  mmol/L Final  . Potassium 11/15/2017 4.1  3.5 - 5.1 mmol/L Final  . Chloride 11/15/2017 106  101 - 111 mmol/L Final  . CO2 11/15/2017 27  22 - 32 mmol/L Final  . Glucose, Bld 11/15/2017 126* 65 - 99 mg/dL Final  . BUN 11/15/2017 15  6 - 20 mg/dL Final  . Creatinine, Ser 11/15/2017 1.03  0.61 - 1.24 mg/dL Final  . Calcium 11/15/2017 8.5* 8.9 - 10.3 mg/dL Final  . GFR calc non Af Amer 11/15/2017 >60  >60 mL/min Final  . GFR calc Af Amer 11/15/2017 >60  >60 mL/min Final   Comment: (NOTE) The eGFR has been calculated using the CKD EPI equation. This calculation has not been validated in all clinical situations. eGFR's persistently <60 mL/min signify possible Chronic Kidney Disease.   Georgiann Hahn gap 11/15/2017 7  5 - 15 Final  Hospital Outpatient Visit on 11/07/2017    Component Date Value Ref Range Status  . aPTT 11/07/2017 27  24 - 36 seconds Final  . WBC 11/07/2017 7.4  4.0 - 10.5 K/uL Final  . RBC 11/07/2017 5.24  4.22 - 5.81 MIL/uL Final  . Hemoglobin 11/07/2017 15.9  13.0 - 17.0 g/dL Final  . HCT 11/07/2017 45.7  39.0 - 52.0 % Final  . MCV 11/07/2017 87.2  78.0 - 100.0 fL Final  . MCH 11/07/2017 30.3  26.0 - 34.0 pg Final  . MCHC 11/07/2017 34.8  30.0 - 36.0 g/dL Final  . RDW 11/07/2017 13.5  11.5 - 15.5 % Final  . Platelets 11/07/2017 285  150 - 400 K/uL Final  . Sodium 11/07/2017 139  135 - 145 mmol/L Final  . Potassium 11/07/2017 3.9  3.5 - 5.1 mmol/L Final  . Chloride 11/07/2017 105  101 - 111 mmol/L Final  . CO2 11/07/2017 26  22 - 32 mmol/L Final  . Glucose, Bld 11/07/2017 99  65 - 99 mg/dL Final  . BUN 11/07/2017 15  6 - 20 mg/dL Final  . Creatinine, Ser 11/07/2017 1.30* 0.61 - 1.24 mg/dL Final  . Calcium 11/07/2017 9.5  8.9 - 10.3 mg/dL Final  . Total Protein 11/07/2017 7.8  6.5 - 8.1 g/dL Final  . Albumin 11/07/2017 4.6  3.5 - 5.0 g/dL Final  . AST 11/07/2017 31  15 - 41 U/L Final  . ALT 11/07/2017 31  17 - 63 U/L Final  . Alkaline Phosphatase 11/07/2017 71  38 - 126 U/L Final  . Total Bilirubin 11/07/2017 0.9  0.3 - 1.2 mg/dL Final  . GFR calc non Af Amer 11/07/2017 >60  >60 mL/min Final  . GFR calc Af Amer 11/07/2017 >60  >60 mL/min Final   Comment: (NOTE) The eGFR has been calculated using the CKD EPI equation. This calculation has not been validated in all clinical situations. eGFR's persistently <60 mL/min signify possible Chronic Kidney Disease.   . Anion gap 11/07/2017 8  5 - 15 Final  . Prothrombin Time 11/07/2017 12.8  11.4 - 15.2 seconds Final  . INR 11/07/2017 0.97   Final  . ABO/RH(D) 11/07/2017 A NEG   Final  . Antibody Screen 11/07/2017 NEG   Final  . Sample Expiration 11/07/2017 11/16/2017   Final  . Extend sample reason 11/07/2017 NO TRANSFUSIONS OR PREGNANCY IN THE PAST 3 MONTHS   Final  . MRSA, PCR  11/07/2017 NEGATIVE  NEGATIVE Final  . Staphylococcus aureus 11/07/2017 POSITIVE* NEGATIVE Final   Comment: (NOTE) The Xpert SA Assay (FDA approved for NASAL specimens in patients  10 years of age and older), is one component of a comprehensive surveillance program. It is not intended to diagnose infection nor to guide or monitor treatment.   . ABO/RH(D) 11/07/2017 A NEG   Final     X-Rays:No results found.  EKG:No orders found for this or any previous visit.   Hospital Course: Nicholas Nash is a 45 y.o. who was admitted to [facility]. They were brought to the operating room on 11/13/2017 and underwent Procedure(s): LEFT TOTAL KNEE ARTHROPLASTY.  Patient tolerated the procedure well and was later transferred to the recovery room and then to the orthopaedic floor for postoperative care.  They were given PO and IV analgesics for pain control following their surgery.  They were given 24 hours of postoperative antibiotics of  Anti-infectives (From admission, onward)   Start     Dose/Rate Route Frequency Ordered Stop   11/14/17 0600  ceFAZolin (ANCEF) IVPB 2g/100 mL premix     2 g 200 mL/hr over 30 Minutes Intravenous On call to O.R. 11/13/17 1523 11/13/17 1707   11/13/17 2230  ceFAZolin (ANCEF) IVPB 2g/100 mL premix     2 g 200 mL/hr over 30 Minutes Intravenous Every 6 hours 11/13/17 2003 11/14/17 0452   11/13/17 1554  ceFAZolin (ANCEF) 2-4 GM/100ML-% IVPB    Comments:  Bridget Hartshorn   : cabinet override      11/13/17 1554 11/13/17 1637     and started on DVT prophylaxis in the form of Xarelto.   PT and OT were ordered for total joint protocol.  Discharge planning consulted to help with postop disposition and equipment needs.  Patient had a tough night on the evening of surgery.  They started to get up OOB with therapy on day one. Hemovac drain was pulled without difficulty.  Continued to work with therapy into day two.  Dressing was changed on day two and the incision was  healing well.   Patient was seen in rounds on day two and was ready to go home.   Discharge home - straight to outpatient Diet - Renal diet Follow up - in 2 weeks Activity - WBAT Disposition - Home Condition Upon Discharge - Good D/C Meds - See DC Summary DVT Prophylaxis - Xarelto    Discharge Instructions    Call MD / Call 911   Complete by:  As directed    If you experience chest pain or shortness of breath, CALL 911 and be transported to the hospital emergency room.  If you develope a fever above 101 F, pus (white drainage) or increased drainage or redness at the wound, or calf pain, call your surgeon's office.   Change dressing   Complete by:  As directed    Change dressing daily with sterile 4 x 4 inch gauze dressing and apply TED hose. Do not submerge the incision under water.   Constipation Prevention   Complete by:  As directed    Drink plenty of fluids.  Prune juice may be helpful.  You may use a stool softener, such as Colace (over the counter) 100 mg twice a day.  Use MiraLax (over the counter) for constipation as needed.   Diet - low sodium heart healthy   Complete by:  As directed    Discharge instructions   Complete by:  As directed    Take Xarelto for two and a half more weeks, then discontinue Xarelto. Once the patient has completed the blood thinner regimen, then take a Baby  81 mg Aspirin daily for three more weeks.   Pick up stool softner and laxative for home use following surgery while on pain medications. Do not submerge incision under water. Please use good hand washing techniques while changing dressing each day. May shower starting three days after surgery. Please use a clean towel to pat the incision dry following showers. Continue to use ice for pain and swelling after surgery. Do not use any lotions or creams on the incision until instructed by your surgeon.  Wear both TED hose on both legs during the day every day for three weeks, but may remove the  TED hose at night at home.  Postoperative Constipation Protocol  Constipation - defined medically as fewer than three stools per week and severe constipation as less than one stool per week.  One of the most common issues patients have following surgery is constipation.  Even if you have a regular bowel pattern at home, your normal regimen is likely to be disrupted due to multiple reasons following surgery.  Combination of anesthesia, postoperative narcotics, change in appetite and fluid intake all can affect your bowels.  In order to avoid complications following surgery, here are some recommendations in order to help you during your recovery period.  Colace (docusate) - Pick up an over-the-counter form of Colace or another stool softener and take twice a day as long as you are requiring postoperative pain medications.  Take with a full glass of water daily.  If you experience loose stools or diarrhea, hold the colace until you stool forms back up.  If your symptoms do not get better within 1 week or if they get worse, check with your doctor.  Dulcolax (bisacodyl) - Pick up over-the-counter and take as directed by the product packaging as needed to assist with the movement of your bowels.  Take with a full glass of water.  Use this product as needed if not relieved by Colace only.   MiraLax (polyethylene glycol) - Pick up over-the-counter to have on hand.  MiraLax is a solution that will increase the amount of water in your bowels to assist with bowel movements.  Take as directed and can mix with a glass of water, juice, soda, coffee, or tea.  Take if you go more than two days without a movement. Do not use MiraLax more than once per day. Call your doctor if you are still constipated or irregular after using this medication for 7 days in a row.  If you continue to have problems with postoperative constipation, please contact the office for further assistance and recommendations.  If you experience  "the worst abdominal pain ever" or develop nausea or vomiting, please contact the office immediatly for further recommendations for treatment.   Do not put a pillow under the knee. Place it under the heel.   Complete by:  As directed    Do not sit on low chairs, stoools or toilet seats, as it may be difficult to get up from low surfaces   Complete by:  As directed    Driving restrictions   Complete by:  As directed    No driving until released by the physician.   Increase activity slowly as tolerated   Complete by:  As directed    Lifting restrictions   Complete by:  As directed    No lifting until released by the physician.   Patient may shower   Complete by:  As directed    You may shower without  a dressing once there is no drainage.  Do not wash over the wound.  If drainage remains, do not shower until drainage stops.   TED hose   Complete by:  As directed    Use stockings (TED hose) for 3 weeks on both leg(s).  You may remove them at night for sleeping.   Weight bearing as tolerated   Complete by:  As directed    Laterality:  left   Extremity:  Lower     Allergies as of 11/15/2017   No Known Allergies     Medication List    STOP taking these medications   ACIDOPHILUS PO   methocarbamol 750 MG tablet Commonly known as:  ROBAXIN   MULTIVITAMIN PO   Vitamin D3 2000 units Tabs     TAKE these medications   buPROPion 100 MG tablet Commonly known as:  WELLBUTRIN Take 100 mg by mouth daily.   cyclobenzaprine 10 MG tablet Commonly known as:  FLEXERIL Take 1 tablet (10 mg total) by mouth 3 (three) times daily as needed for muscle spasms.   oxyCODONE 5 MG immediate release tablet Commonly known as:  Oxy IR/ROXICODONE Take 1 tablet (5 mg total) by mouth every 4 (four) hours as needed for moderate pain or severe pain.   ranitidine 150 MG tablet Commonly known as:  ZANTAC Take 150 mg by mouth 2 (two) times daily.   rivaroxaban 10 MG Tabs tablet Commonly known as:   XARELTO Take 1 tablet (10 mg total) by mouth daily with breakfast. Take Xarelto for two and a half more weeks following discharge from the hospital, then discontinue Xarelto. Once the patient has completed the blood thinner regimen, then take a Baby 81 mg Aspirin daily for three more weeks.   venlafaxine XR 75 MG 24 hr capsule Commonly known as:  EFFEXOR-XR Take 75 mg by mouth daily with breakfast.            Durable Medical Equipment  (From admission, onward)        Start     Ordered   11/14/17 1415  For home use only DME Walker rolling  Once    Comments:  Needs tall RW 6'3"  Question:  Patient needs a walker to treat with the following condition  Answer:  S/P knee surgery   11/14/17 1415       Discharge Care Instructions  (From admission, onward)        Start     Ordered   11/14/17 0000  Weight bearing as tolerated    Question Answer Comment  Laterality left   Extremity Lower      11/14/17 0842   11/14/17 0000  Change dressing    Comments:  Change dressing daily with sterile 4 x 4 inch gauze dressing and apply TED hose. Do not submerge the incision under water.   11/14/17 1219     Follow-up Information    Gaynelle Arabian, MD. Schedule an appointment as soon as possible for a visit on 11/28/2017.   Specialty:  Orthopedic Surgery Contact information: [address] [place] [address] [phone]           Signed: Arlee Muslim, PA-C Orthopaedic Surgery 11/15/2017, 8:22 AM

## 2017-11-14 NOTE — Evaluation (Signed)
Occupational Therapy Evaluation Patient Details Name: Nicholas Nash MRN: [id number] DOB: 09/05/1971 Today's Date: 11/14/2017    History of Present Illness LEFT TOTAL KNEE ARTHROPLASTY (Left)   Clinical Impression   OT education complete.  No DME needs    Follow Up Recommendations    n/a   Equipment Recommendations  None recommended by OT       Precautions / Restrictions Precautions Precautions: Knee Restrictions Weight Bearing Restrictions: Yes      Mobility Bed Mobility Overal bed mobility: Needs Assistance Bed Mobility: Supine to Sit     Supine to sit: Supervision        Transfers Overall transfer level: Needs assistance Equipment used: Rolling walker (2 wheeled) Transfers: Sit to/from BJ'sStand;Stand Pivot Transfers Sit to Stand: Supervision Stand pivot transfers: Supervision       General transfer comment: VC for safety, hand placement and LE placement        ADL either performed or assessed with clinical judgement   ADL Overall ADL's : Needs assistance/impaired Eating/Feeding: Set up;Sitting   Grooming: Set up;Sitting   Upper Body Bathing: Set up;Sitting   Lower Body Bathing: Minimal assistance;Sit to/from stand;Cueing for sequencing;Cueing for safety   Upper Body Dressing : Set up;Sitting   Lower Body Dressing: Minimal assistance;Sit to/from stand;Cueing for safety;Cueing for sequencing   Toilet Transfer: Supervision/safety;Ambulation;Comfort height toilet;Regular Toilet;RW   Toileting- Clothing Manipulation and Hygiene: Min guard;Sit to/from stand;Cueing for safety     Tub/Shower Transfer Details (indicate cue type and reason): verbalized safety Functional mobility during ADLs: Supervision/safety;Rolling walker       Vision Patient Visual Report: No change from baseline              Pertinent Vitals/Pain Pain Assessment: 0-10 Pain Score: 3  Pain Descriptors / Indicators: Sore Pain Intervention(s): Limited activity within  patient's tolerance;Repositioned     Hand Dominance     Extremity/Trunk Assessment Upper Extremity Assessment Upper Extremity Assessment: Overall WFL for tasks assessed           Communication     Cognition Arousal/Alertness: Awake/alert Behavior During Therapy: WFL for tasks assessed/performed Overall Cognitive Status: Within Functional Limits for tasks assessed                                                Home Living Family/patient expects to be discharged to:: Private residence Living Arrangements: Spouse/significant other                 Bathroom Shower/Tub: Producer, television/film/videoWalk-in shower   Bathroom Toilet: Handicapped height         Additional Comments: Pt reports wife is a Engineer, civil (consulting)nurse and will A as needed      Prior Functioning/Environment Level of Independence: Independent                          OT Goals(Current goals can be found in the care plan section) Acute Rehab OT Goals Patient Stated Goal: home today OT Goal Formulation: With patient  OT Frequency:                AM-PAC PT "6 Clicks" Daily Activity     Outcome Measure Help from another person eating meals?: None Help from another person taking care of personal grooming?: None Help from another person toileting, which includes using toliet, bedpan, or urinal?:  A Little Help from another person bathing (including washing, rinsing, drying)?: None Help from another person to put on and taking off regular upper body clothing?: None Help from another person to put on and taking off regular lower body clothing?: A Little 6 Click Score: 22   End of Session Equipment Utilized During Treatment: Rolling walker Nurse Communication: Mobility status  Activity Tolerance: Patient tolerated treatment well Patient left: in chair                   Time: [id number] OT Time Calculation (min): 9 min Charges:  OT General Charges $OT Visit: 1 Visit OT Evaluation $OT Eval Low Complexity: 1  Low G-Codes:     Lise AuerLori Bertrice Leder, OT [phone]  Einar CrowEDDING, Curran Lenderman D 11/14/2017, 10:33 AM

## 2017-11-14 NOTE — Progress Notes (Signed)
   Subjective: 1 Day Post-Op Procedure(s) (LRB): LEFT TOTAL KNEE ARTHROPLASTY (Left) Patient reports pain as mild.   Patient seen in rounds with Dr. Lequita HaltAluisio. Looks fairly comfortable this morning. Patient is well, but has had some minor complaints of pain in the knee, requiring pain medications We will start therapy today.  If they do well with therapy and meets all goals, then will allow home later this afternoon following therapy. Plan is to go Home after hospital stay.  Objective: Vital signs in last 24 hours: Temp:  [97.3 F (36.3 C)-98.6 F (37 C)] 97.6 F (36.4 C) (12/13 0524) Pulse Rate:  [54-76] 72 (12/13 0524) Resp:  [9-24] 15 (12/13 0524) BP: (93-133)/(63-92) 118/72 (12/13 0524) SpO2:  [93 %-100 %] 96 % (12/13 0524) Weight:  [117.5 kg (259 lb)] 117.5 kg (259 lb) (12/12 1545)  Intake/Output from previous day:  Intake/Output Summary (Last 24 hours) at 11/14/2017 0835 Last data filed at 11/14/2017 0749 Gross per 24 hour  Intake 4440 ml  Output 2430 ml  Net 2010 ml    Intake/Output this shift: Total I/O In: 360 [P.O.:360] Out: -   Labs: Recent Labs    11/14/17 0548  HGB 13.7   Recent Labs    11/14/17 0548  WBC 16.8*  RBC 4.67  HCT 41.2  PLT 265   Recent Labs    11/14/17 0548  NA 139  K 4.3  CL 105  CO2 27  BUN 17  CREATININE 1.20  GLUCOSE 132*  CALCIUM 8.9   No results for input(s): LABPT, INR in the last 72 hours.  EXAM General - Patient is Alert, Appropriate and Oriented Extremity - Neurovascular intact Sensation intact distally Intact pulses distally Dorsiflexion/Plantar flexion intact Dressing - dressing C/D/I Motor Function - intact, moving foot and toes well on exam.  Hemovac pulled without difficulty.  Past Medical History:  Diagnosis Date  . Anxiety    PTSD  . Arthritis   . Chronic kidney disease   . Depression   . GERD (gastroesophageal reflux disease)   . Sleep apnea     Assessment/Plan: 1 Day Post-Op Procedure(s)  (LRB): LEFT TOTAL KNEE ARTHROPLASTY (Left) Principal Problem:   OA (osteoarthritis) of knee  Estimated body mass index is 32.37 kg/m as calculated from the following:   Height as of this encounter: 6\' 3"  (1.905 m).   Weight as of this encounter: 117.5 kg (259 lb). Advance diet Up with therapy  DVT Prophylaxis - Xarelto Weight-Bearing as tolerated to left leg D/C O2 and Pulse OX and try on Room Air  If meets goals and able to go home: Discharge home - straight to outpatient Diet - Renal diet Follow up - in 2 weeks Activity - WBAT Disposition - Home Condition Upon Discharge - pending therapy D/C Meds - See DC Summary DVT Prophylaxis - Xarelto  Nicholas Peacerew Jamesyn Lindell, PA-C Orthopaedic Surgery

## 2017-11-14 NOTE — Progress Notes (Signed)
Discharge planning, no HH needs identified. Plan for OP PT, has DME. [phone] 

## 2017-11-15 LAB — BASIC METABOLIC PANEL
ANION GAP: 7 (ref 5–15)
BUN: 15 mg/dL (ref 6–20)
CHLORIDE: 106 mmol/L (ref 101–111)
CO2: 27 mmol/L (ref 22–32)
CREATININE: 1.03 mg/dL (ref 0.61–1.24)
Calcium: 8.5 mg/dL — ABNORMAL LOW (ref 8.9–10.3)
GFR calc non Af Amer: >60 mL/min (ref >60)
Glucose, Bld: 126 mg/dL — ABNORMAL HIGH (ref 65–99)
Potassium: 4.1 mmol/L (ref 3.5–5.1)
SODIUM: 140 mmol/L (ref 135–145)

## 2017-11-15 LAB — CBC
HCT: 36.4 % — ABNORMAL LOW (ref 39.0–52.0)
HEMOGLOBIN: 12.1 g/dL — AB (ref 13.0–17.0)
MCH: 29.4 pg (ref 26.0–34.0)
MCHC: 33.2 g/dL (ref 30.0–36.0)
MCV: 88.3 fL (ref 78.0–100.0)
Platelets: 237 10*3/uL (ref 150–400)
RBC: 4.12 MIL/uL — AB (ref 4.22–5.81)
RDW: 13.4 % (ref 11.5–15.5)
WBC: 15.3 10*3/uL — AB (ref 4.0–10.5)

## 2017-11-15 MED ORDER — OXYCODONE HCL 5 MG PO TABS: 5.0000 mg | ORAL_TABLET | ORAL | 0 refills | Status: DC | PRN

## 2017-11-15 MED ORDER — RIVAROXABAN 10 MG PO TABS: 10.0000 mg | ORAL_TABLET | Freq: Every day | ORAL | 0 refills | Status: DC

## 2017-11-15 MED ORDER — CYCLOBENZAPRINE HCL 10 MG PO TABS
10.0000 mg | ORAL_TABLET | Freq: Three times a day (TID) | ORAL | Status: DC | PRN
  Administered 2017-11-15: 07:00:00 10 mg via ORAL
  Filled 2017-11-15: qty 1

## 2017-11-15 MED ORDER — CHLORPROMAZINE HCL 10 MG PO TABS
10.0000 mg | ORAL_TABLET | Freq: Once | ORAL | Status: AC
  Administered 2017-11-15: 10 mg via ORAL
  Filled 2017-11-15: qty 1

## 2017-11-15 MED ORDER — CYCLOBENZAPRINE HCL 10 MG PO TABS: 10.0000 mg | ORAL_TABLET | Freq: Three times a day (TID) | ORAL | 0 refills | Status: DC | PRN

## 2017-11-15 NOTE — Progress Notes (Signed)
Physical Therapy Treatment Patient Details Name: Nicholas NashMichael W Nash MRN: [id number] DOB: 06-22-1971 Today's Date: 11/15/2017    History of Present Illness LEFT TOTAL KNEE ARTHROPLASTY (Left)    PT Comments    POD # 2 am session Assisted OOB to amb in hallway, practice stairs then performed all supine TKR TE's following HEP handout.  Instructed on proper tech, freq as well as use of ICE.    Follow Up Recommendations  Outpatient PT;DC plan and follow up therapy as arranged by surgeon     Equipment Recommendations  None recommended by PT    Recommendations for Other Services       Precautions / Restrictions Precautions Precautions: Knee Precaution Comments: did not use KI Restrictions Weight Bearing Restrictions: No    Mobility  Bed Mobility Overal bed mobility: Independent                Transfers Overall transfer level: Modified independent   Transfers: Sit to/from Stand           General transfer comment: VC for safety, hand placement and LE placement, patient is impulsive  Ambulation/Gait Ambulation/Gait assistance: Supervision Ambulation Distance (Feet): 75 Feet Assistive device: Rolling walker (2 wheeled)       General Gait Details: cues for sequence and posture inside RW.   Stairs Stairs: Yes   Stair Management: Step to pattern;Forwards;With crutches Number of Stairs: 2 General stair comments: one rail and one crutch with 50% VC's on proper sequencing as pt is impulsive   Wheelchair Mobility    Modified Rankin (Stroke Patients Only)       Balance                                            Cognition Arousal/Alertness: Awake/alert Behavior During Therapy: WFL for tasks assessed/performed Overall Cognitive Status: Within Functional Limits for tasks assessed                                        Exercises    Total Knee Replacement TE's 10 reps B LE ankle pumps 10 reps towel squeezes 10 reps  knee presses 10 reps heel slides  10 reps SAQ's 10 reps SLR's 10 reps ABD Followed by ICE     General Comments        Pertinent Vitals/Pain Pain Assessment: 0-10 Pain Score: 8  Pain Location: left knee Pain Descriptors / Indicators: Operative site guarding;Discomfort Pain Intervention(s): Monitored during session;Repositioned;Ice applied    Home Living                      Prior Function            PT Goals (current goals can now be found in the care plan section) Progress towards PT goals: Progressing toward goals    Frequency    7X/week      PT Plan Current plan remains appropriate    Co-evaluation              AM-PAC PT "6 Clicks" Daily Activity  Outcome Measure  Difficulty turning over in bed (including adjusting bedclothes, sheets and blankets)?: None Difficulty moving from lying on back to sitting on the side of the bed? : None Difficulty sitting down on and standing up from a chair  with arms (e.g., wheelchair, bedside commode, etc,.)?: A Little Help needed moving to and from a bed to chair (including a wheelchair)?: A Little Help needed walking in hospital room?: A Little Help needed climbing 3-5 steps with a railing? : A Little 6 Click Score: 20    End of Session Equipment Utilized During Treatment: Gait belt Activity Tolerance: Patient tolerated treatment well Patient left: in bed;with call bell/phone within reach Nurse Communication: Mobility status PT Visit Diagnosis: Unsteadiness on feet (R26.81) Pain - Right/Left: Left Pain - part of body: Knee     Time: [id number] PT Time Calculation (min) (ACUTE ONLY): 27 min  Charges:  $Gait Training: 8-22 mins $Therapeutic Activity: 8-22 mins                    G Codes:       Nicholas ShellingLori Geoge Nash  PTA [facility]  Acute  Rehab Pager      [phone]

## 2017-11-15 NOTE — Progress Notes (Signed)
   Subjective: 2 Days Post-Op Procedure(s) (LRB): LEFT TOTAL KNEE ARTHROPLASTY (Left) Patient reports pain as mild and moderate.   Patient seen in rounds for Dr. Lequita HaltAluisio.  He is pretty sore today after doing well with therapy yesterday. Patient is well, but has had some minor complaints of pain in the knee, requiring pain medications Patient is ready to go home.  Objective: Vital signs in last 24 hours: Temp:  [97.6 F (36.4 C)-98.2 F (36.8 C)] 97.7 F (36.5 C) (12/14 0609) Pulse Rate:  [64-82] 65 (12/14 0609) Resp:  [16] 16 (12/14 0609) BP: (123-131)/(75-80) 128/80 (12/14 0609) SpO2:  [95 %-100 %] 100 % (12/14 0609)  Intake/Output from previous day:  Intake/Output Summary (Last 24 hours) at 11/15/2017 0818 Last data filed at 11/15/2017 0355 Gross per 24 hour  Intake 1214.33 ml  Output 1150 ml  Net 64.33 ml    Intake/Output this shift: No intake/output data recorded.  Labs: Recent Labs    11/14/17 0548 11/15/17 0557  HGB 13.7 12.1*   Recent Labs    11/14/17 0548 11/15/17 0557  WBC 16.8* 15.3*  RBC 4.67 4.12*  HCT 41.2 36.4*  PLT 265 237   Recent Labs    11/14/17 0548 11/15/17 0557  NA 139 140  K 4.3 4.1  CL 105 106  CO2 27 27  BUN 17 15  CREATININE 1.20 1.03  GLUCOSE 132* 126*  CALCIUM 8.9 8.5*   No results for input(s): LABPT, INR in the last 72 hours.  EXAM: General - Patient is Alert, Appropriate and Oriented Extremity - Neurovascular intact Sensation intact distally Intact pulses distally Dorsiflexion/Plantar flexion intact Incision - clean, dry, no drainage Motor Function - intact, moving foot and toes well on exam.   Assessment/Plan: 2 Days Post-Op Procedure(s) (LRB): LEFT TOTAL KNEE ARTHROPLASTY (Left) Procedure(s) (LRB): LEFT TOTAL KNEE ARTHROPLASTY (Left) Past Medical History:  Diagnosis Date  . Anxiety    PTSD  . Arthritis   . Chronic kidney disease   . Depression   . GERD (gastroesophageal reflux disease)   . Sleep apnea     Principal Problem:   OA (osteoarthritis) of knee  Estimated body mass index is 32.37 kg/m as calculated from the following:   Height as of this encounter: 6\' 3"  (1.905 m).   Weight as of this encounter: 117.5 kg (259 lb). Up with therapy Discharge home - straight to outpatient Diet - Renal diet Follow up - in 2 weeks Activity - WBAT Disposition - Home Condition Upon Discharge - Good D/C Meds - See DC Summary DVT Prophylaxis - Xarelto  Nicholas Peacerew Ebubechukwu Jedlicka, PA-C Orthopaedic Surgery 11/15/2017, 8:18 AM

## 2017-11-15 NOTE — Progress Notes (Signed)
Patient discharged to home with family. Given all belongings, instructions, prescriptions. Verbalized understanding of all instructions. Escorted to POV via w.c.

## 2017-11-18 NOTE — Anesthesia Postprocedure Evaluation (Signed)
Anesthesia Post Note  Patient: Nicholas Nash  Procedure(s) Performed: LEFT TOTAL KNEE ARTHROPLASTY (Left Knee)     Patient location during evaluation: PACU Anesthesia Type: Spinal Level of consciousness: awake Pain management: pain level controlled Vital Signs Assessment: post-procedure vital signs reviewed and stable Respiratory status: spontaneous breathing Cardiovascular status: stable Postop Assessment: no headache, no backache, spinal receding, no apparent nausea or vomiting and patient able to bend at knees Anesthetic complications: no    Last Vitals:  Vitals:   11/14/17 2100 11/15/17 0609  BP: 123/76 128/80  Pulse: 64 65  Resp: 16 16  Temp: 36.4 C 36.5 C  SpO2: 97% 100%    Last Pain:  Vitals:   11/15/17 1213  TempSrc:   PainSc: 3    Pain Goal: Patients Stated Pain Goal: 3 (11/15/17 1213)               Avanell Banwart JR,[name]

## 2018-05-22 NOTE — Progress Notes (Signed)
Please place orders in epic for preop Thank You 

## 2018-06-17 ENCOUNTER — Encounter (HOSPITAL_COMMUNITY): Payer: Non-veteran care

## 2018-06-23 ENCOUNTER — Inpatient Hospital Stay: Admit: 2018-06-23 | Payer: Non-veteran care | Admitting: Orthopedic Surgery

## 2018-06-23 SURGERY — ARTHROPLASTY, KNEE, TOTAL
Anesthesia: Choice | Site: Knee | Laterality: Right

## 2018-07-31 NOTE — Patient Instructions (Addendum)
Nicholas NashMichael W Broussard  07/31/2018   Your procedure is scheduled on: 08-11-18 Monday    [facility]    Report to Admitting at 6:55 AM    Call this number if you have problems the morning of surgery [phone]    Remember: Do not eat food or drink liquids :After Midnight.     Take these medicines the morning of surgery with A SIP OF WATER: Bupropion (Wellbutrin), Omeprazole (Prilosec), and Venlafaxine XR                                You may not have any metal on your body including hair pins and              piercings  Do not wear jewelry, lotions, powders, cologne or deodorant             Men may shave face and neck.   Do not bring valuables to the hospital. [facility] IS NOT             RESPONSIBLE   FOR VALUABLES.  Contacts, dentures or bridgework may not be worn into surgery.  Leave suitcase in the car. After surgery it may be brought to your room.                  Please read over the following fact sheets you were given: _____________________________________________________________________           [facility] Pre[facility] Before surgery, you can play an important role.  Because skin is not sterile, your skin needs to be as free of germs as possible.  You can reduce the number of germs on your skin by washing with CHG (chlorahexidine gluconate) soap before surgery.  CHG is an antiseptic cleaner which kills germs and bonds with the skin to continue killing germs even after washing. Please DO NOT use if you have an allergy to CHG or antibacterial soaps.  If your skin becomes reddened/irritated stop using the CHG and inform your nurse when you arrive at Short Stay. Do not shave (including legs and underarms) for at least 48 hours prior to the first CHG shower.  You may shave your face/neck. Please follow these instructions carefully:  1.  Shower with CHG Soap the night before surgery and the  morning of Surgery.  2.   If you choose to wash your hair, wash your hair first as usual with your  normal  shampoo.  3.  After you shampoo, rinse your hair and body thoroughly to remove the  shampoo.                           4.  Use CHG as you would any other liquid soap.  You can apply chg directly  to the skin and wash                       Gently with a scrungie or clean washcloth.  5.  Apply the CHG Soap to your body ONLY FROM THE NECK DOWN.   Do not use on face/ open                           Wound or  open sores. Avoid contact with eyes, ears mouth and genitals (private parts).                       Wash face,  Genitals (private parts) with your normal soap.             6.  Wash thoroughly, paying special attention to the area where your surgery  will be performed.  7.  Thoroughly rinse your body with warm water from the neck down.  8.  DO NOT shower/wash with your normal soap after using and rinsing off  the CHG Soap.                9.  Pat yourself dry with a clean towel.            10.  Wear clean pajamas.            11.  Place clean sheets on your bed the night of your first shower and do not  sleep with pets. Day of Surgery : Do not apply any lotions/deodorants the morning of surgery.  Please wear clean clothes to the hospital/surgery center.  FAILURE TO FOLLOW THESE INSTRUCTIONS MAY RESULT IN THE CANCELLATION OF YOUR SURGERY PATIENT SIGNATURE_________________________________  NURSE SIGNATURE__________________________________  ________________________________________________________________________   Adam Phenix  An incentive spirometer is a tool that can help keep your lungs clear and active. This tool measures how well you are filling your lungs with each breath. Taking long deep breaths may help reverse or decrease the chance of developing breathing (pulmonary) problems (especially infection) following:  A long period of time when you are unable to move or be active. BEFORE THE PROCEDURE    If the spirometer includes an indicator to show your best effort, your nurse or respiratory therapist will set it to a desired goal.  If possible, sit up straight or lean slightly forward. Try not to slouch.  Hold the incentive spirometer in an upright position. INSTRUCTIONS FOR USE  1. Sit on the edge of your bed if possible, or sit up as far as you can in bed or on a chair. 2. Hold the incentive spirometer in an upright position. 3. Breathe out normally. 4. Place the mouthpiece in your mouth and seal your lips tightly around it. 5. Breathe in slowly and as deeply as possible, raising the piston or the ball toward the top of the column. 6. Hold your breath for 3-5 seconds or for as long as possible. Allow the piston or ball to fall to the bottom of the column. 7. Remove the mouthpiece from your mouth and breathe out normally. 8. Rest for a few seconds and repeat Steps 1 through 7 at least 10 times every 1-2 hours when you are awake. Take your time and take a few normal breaths between deep breaths. 9. The spirometer may include an indicator to show your best effort. Use the indicator as a goal to work toward during each repetition. 10. After each set of 10 deep breaths, practice coughing to be sure your lungs are clear. If you have an incision (the cut made at the time of surgery), support your incision when coughing by placing a pillow or rolled up towels firmly against it. Once you are able to get out of bed, walk around indoors and cough well. You may stop using the incentive spirometer when instructed by your caregiver.  RISKS AND COMPLICATIONS  Take your time so you do not get dizzy or  light-headed.  If you are in pain, you may need to take or ask for pain medication before doing incentive spirometry. It is harder to take a deep breath if you are having pain. AFTER USE  Rest and breathe slowly and easily.  It can be helpful to keep track of a log of your progress. Your caregiver  can provide you with a simple table to help with this. If you are using the spirometer at home, follow these instructions: [place] IF:   You are having difficultly using the spirometer.  You have trouble using the spirometer as often as instructed.  Your pain medication is not giving enough relief while using the spirometer.  You develop fever of 100.5 F (38.1 C) or higher. SEEK IMMEDIATE MEDICAL CARE IF:   You cough up bloody sputum that had not been present before.  You develop fever of 102 F (38.9 C) or greater.  You develop worsening pain at or near the incision site. MAKE SURE YOU:   Understand these instructions.  Will watch your condition.  Will get help right away if you are not doing well or get worse. Document Released: 04/01/2007 Document Revised: 02/11/2012 Document Reviewed: 06/02/2007 Exit[employer] Patient Information 2014 Exit[employer], [place].   ________________________________________________________________________  WHAT IS A BLOOD TRANSFUSION? Blood Transfusion Information  A transfusion is the replacement of blood or some of its parts. Blood is made up of multiple cells which provide different functions.  Red blood cells carry oxygen and are used for blood loss replacement.  White blood cells fight against infection.  Platelets control bleeding.  Plasma helps clot blood.  Other blood products are available for specialized needs, such as hemophilia or other clotting disorders. BEFORE THE TRANSFUSION  Who gives blood for transfusions?   Healthy volunteers who are fully evaluated to make sure their blood is safe. This is blood bank blood. Transfusion therapy is the safest it has ever been in the practice of medicine. Before blood is taken from a donor, a complete history is taken to make sure that person has no history of diseases nor engages in risky social behavior (examples are intravenous drug use or sexual activity with multiple partners). The  donor's travel history is screened to minimize risk of transmitting infections, such as malaria. The donated blood is tested for signs of infectious diseases, such as HIV and hepatitis. The blood is then tested to be sure it is compatible with you in order to minimize the chance of a transfusion reaction. If you or a relative donates blood, this is often done in anticipation of surgery and is not appropriate for emergency situations. It takes many days to process the donated blood. RISKS AND COMPLICATIONS Although transfusion therapy is very safe and saves many lives, the main dangers of transfusion include:   Getting an infectious disease.  Developing a transfusion reaction. This is an allergic reaction to something in the blood you were given. Every precaution is taken to prevent this. The decision to have a blood transfusion has been considered carefully by your caregiver before blood is given. Blood is not given unless the benefits outweigh the risks. AFTER THE TRANSFUSION  Right after receiving a blood transfusion, you will usually feel much better and more energetic. This is especially true if your red blood cells have gotten low (anemic). The transfusion raises the level of the red blood cells which carry oxygen, and this usually causes an energy increase.  The nurse administering the transfusion will monitor you  carefully for complications. HOME CARE INSTRUCTIONS  No special instructions are needed after a transfusion. You may find your energy is better. Speak with your caregiver about any limitations on activity for underlying diseases you may have. SEEK MEDICAL CARE IF:   Your condition is not improving after your transfusion.  You develop redness or irritation at the intravenous (IV) site. SEEK IMMEDIATE MEDICAL CARE IF:  Any of the following symptoms occur over the next 12 hours:  Shaking chills.  You have a temperature by mouth above 102 F (38.9 C), not controlled by  medicine.  Chest, back, or muscle pain.  People around you feel you are not acting correctly or are confused.  Shortness of breath or difficulty breathing.  Dizziness and fainting.  You get a rash or develop hives.  You have a decrease in urine output.  Your urine turns a dark color or changes to pink, red, or brown. Any of the following symptoms occur over the next 10 days:  You have a temperature by mouth above 102 F (38.9 C), not controlled by medicine.  Shortness of breath.  Weakness after normal activity.  The white part of the eye turns yellow (jaundice).  You have a decrease in the amount of urine or are urinating less often.  Your urine turns a dark color or changes to pink, red, or brown. Document Released: 11/16/2000 Document Revised: 02/11/2012 Document Reviewed: 07/05/2008 [facility] Patient Information 2014 [place], [place].  _______________________________________________________________________

## 2018-08-05 ENCOUNTER — Encounter (HOSPITAL_COMMUNITY): Payer: Self-pay

## 2018-08-05 ENCOUNTER — Encounter (HOSPITAL_COMMUNITY)
Admission: RE | Admit: 2018-08-05 | Discharge: 2018-08-05 | Disposition: A | Discharge status: Home / Self Care | Payer: No Typology Code available for payment source | Source: Ambulatory Visit | Attending: Orthopedic Surgery | Admitting: Orthopedic Surgery

## 2018-08-05 ENCOUNTER — Other Ambulatory Visit: Payer: Self-pay

## 2018-08-05 DIAGNOSIS — Z01812 Encounter for preprocedural laboratory examination: Secondary | ICD-10-CM | POA: Insufficient documentation

## 2018-08-05 DIAGNOSIS — M1711 Unilateral primary osteoarthritis, right knee: Secondary | ICD-10-CM | POA: Diagnosis not present

## 2018-08-05 LAB — CBC
HCT: 46.6 % (ref 39.0–52.0)
Hemoglobin: 15.6 g/dL (ref 13.0–17.0)
MCH: 30.1 pg (ref 26.0–34.0)
MCHC: 33.5 g/dL (ref 30.0–36.0)
MCV: 90 fL (ref 78.0–100.0)
PLATELETS: 303 10*3/uL (ref 150–400)
RBC: 5.18 MIL/uL (ref 4.22–5.81)
RDW: 13 % (ref 11.5–15.5)
WBC: 6.9 10*3/uL (ref 4.0–10.5)

## 2018-08-05 LAB — COMPREHENSIVE METABOLIC PANEL
ALK PHOS: 57 U/L (ref 38–126)
ALT: 30 U/L (ref 0–44)
ANION GAP: 10 (ref 5–15)
AST: 32 U/L (ref 15–41)
Albumin: 4.3 g/dL (ref 3.5–5.0)
BUN: 17 mg/dL (ref 6–20)
CALCIUM: 9.3 mg/dL (ref 8.9–10.3)
CHLORIDE: 103 mmol/L (ref 98–111)
CO2: 27 mmol/L (ref 22–32)
Creatinine, Ser: 1.26 mg/dL — ABNORMAL HIGH (ref 0.61–1.24)
GFR calc Af Amer: >60 mL/min (ref >60)
GFR calc non Af Amer: >60 mL/min (ref >60)
Glucose, Bld: 86 mg/dL (ref 70–99)
Potassium: 4 mmol/L (ref 3.5–5.1)
SODIUM: 140 mmol/L (ref 135–145)
Total Bilirubin: 0.6 mg/dL (ref 0.3–1.2)
Total Protein: 6.9 g/dL (ref 6.5–8.1)

## 2018-08-05 LAB — SURGICAL PCR SCREEN
MRSA, PCR: NEGATIVE
Staphylococcus aureus: POSITIVE — AB

## 2018-08-05 LAB — APTT: aPTT: 26 seconds (ref 24–36)

## 2018-08-05 LAB — PROTIME-INR
INR: 0.97
Prothrombin Time: 12.8 seconds (ref 11.4–15.2)

## 2018-08-08 NOTE — H&P (Signed)
TOTAL KNEE ADMISSION H&P  Patient is being admitted for right total knee arthroplasty.  Subjective:  Chief Complaint:right knee pain.  HPI: Nicholas Nash, 47 y.o. male, has a history of pain and functional disability in the right knee due to arthritis and has failed non-surgical conservative treatments for greater than 12 weeks to includeNSAID's and/or analgesics, corticosteriod injections, viscosupplementation injections and activity modification.  Onset of symptoms was gradual, starting 5 years ago with gradually worsening course since that time. The patient noted prior procedures on the knee to include  arthroscopy and menisectomy on the right knee(s).  Patient currently rates pain in the right knee(s) at 8 out of 10 with activity. Patient has night pain, worsening of pain with activity and weight bearing, pain that interferes with activities of daily living, pain with passive range of motion, crepitus and joint swelling.  Patient has evidence of periarticular osteophytes and joint space narrowing by imaging studies.  There is no active infection.  Patient Active Problem List   Diagnosis Date Noted  . OA (osteoarthritis) of knee 11/13/2017   Past Medical History:  Diagnosis Date  . Anxiety    PTSD  . Arthritis   . Chronic kidney disease   . Depression   . GERD (gastroesophageal reflux disease)   . Sleep apnea     Past Surgical History:  Procedure Laterality Date  . BACK SURGERY    . KNEE ARTHROSCOPY Bilateral 01/2017  . ROTATOR CUFF REPAIR  2000   Right   . TONSILLECTOMY    . TOTAL KNEE ARTHROPLASTY Left 11/13/2017   Procedure: LEFT TOTAL KNEE ARTHROPLASTY;  Surgeon: Ollen Gross, MD;  Location: [facility];  Service: Orthopedics;  Laterality: Left;  block    Current Outpatient Medications  Medication Sig Dispense Refill Last Dose  . buPROPion (WELLBUTRIN) 100 MG tablet Take 100 mg by mouth daily.   Past Week at Unknown time  . Cholecalciferol (VITAMIN D-3) 1000 units CAPS  Take 1,000 Units by mouth daily.     Marland Kitchen lactobacillus acidophilus (BACID) TABS tablet Take 2 tablets by mouth daily.     . Multiple Vitamin (MULTIVITAMIN WITH MINERALS) TABS tablet Take 1 tablet by mouth daily.     Marland Kitchen omeprazole (PRILOSEC) 40 MG capsule Take 80 mg by mouth daily.     Marland Kitchen venlafaxine XR (EFFEXOR-XR) 75 MG 24 hr capsule Take 225 mg by mouth daily with breakfast.    Past Week at Unknown time  . cyclobenzaprine (FLEXERIL) 10 MG tablet Take 1 tablet (10 mg total) by mouth 3 (three) times daily as needed for muscle spasms. (Patient not taking: Reported on 07/29/2018) 90 tablet 0 Not Taking at Unknown time  . oxyCODONE (OXY IR/ROXICODONE) 5 MG immediate release tablet Take 1 tablet (5 mg total) by mouth every 4 (four) hours as needed for moderate pain or severe pain. (Patient not taking: Reported on 07/29/2018) 80 tablet 0 Not Taking at Unknown time  . rivaroxaban (XARELTO) 10 MG TABS tablet Take 1 tablet (10 mg total) by mouth daily with breakfast. Take Xarelto for two and a half more weeks following discharge from the hospital, then discontinue Xarelto. Once the patient has completed the blood thinner regimen, then take a Baby 81 mg Aspirin daily for three more weeks. (Patient not taking: Reported on 07/29/2018) 19 tablet 0 Not Taking at Unknown time   No Known Allergies  Social History   Tobacco Use  . Smoking status: Never Smoker  . Smokeless tobacco: Never Used  Substance  Use Topics  . Alcohol use: No    Frequency: Never       Review of Systems  Constitutional: Negative.   HENT: Negative.   Eyes: Negative.   Respiratory: Negative.   Cardiovascular: Negative.   Gastrointestinal: Negative.   Genitourinary: Negative.   Musculoskeletal: Positive for joint pain and myalgias. Negative for back pain, falls and neck pain.  Skin: Negative.   Neurological: Negative.   Endo/Heme/Allergies: Negative.   Psychiatric/Behavioral: Negative for depression, hallucinations, memory loss,  substance abuse and suicidal ideas. The patient is nervous/anxious. The patient does not have insomnia.     Objective:  Physical Exam  Ht: 6 ft 3.5 in  Wt: 250 lbs  BMI: 30.8 BP: 128/72 sitting L arm  Pulse: 76 bpm  Pain Scale: 8   Imaging Review Plain radiographs demonstrate severe degenerative joint disease of the right knee(s). The overall alignment ismild varus. The bone quality appears to be good for age and reported activity level.   Preoperative templating of the joint replacement has been completed, documented, and submitted to the Operating Room personnel in order to optimize intra-operative equipment management.   Anticipated LOS equal to or greater than 2 midnights due to - Age 53 and older with one or more of the following:  - Obesity  - Expected need for hospital services (PT, OT, Nursing) required for safe  discharge  - Anticipated need for postoperative skilled nursing care or inpatient rehab  - Active co-morbidities: Chronic kidney disease OR   - Unanticipated findings during/Post Surgery: None  - Patient is a high risk of re-admission due to: None     Assessment/Plan:  End stage primary osteoarthritis, right knee   The patient history, physical examination, clinical judgment of the provider and imaging studies are consistent with end stage degenerative joint disease of the right knee(s) and total knee arthroplasty is deemed medically necessary. The treatment options including medical management, injection therapy arthroscopy and arthroplasty were discussed at length. The risks and benefits of total knee arthroplasty were presented and reviewed. The risks due to aseptic loosening, infection, stiffness, patella tracking problems, thromboembolic complications and other imponderables were discussed. The patient acknowledged the explanation, agreed to proceed with the plan and consent was signed. Patient is being admitted for inpatient treatment for surgery, pain  control, PT, OT, prophylactic antibiotics, VTE prophylaxis, progressive ambulation and ADL's and discharge planning. The patient is planning to be discharged home.  Therapy Plans: outpatient therapy at [facility] Disposition: Home with wife Planned DVT prophylaxis: aspirin 325mg  BID DME needed: none needed [facility] Eduard Clos Other: no anesthesia concerns  Dimitri Ped, PA-C

## 2018-08-10 MED ORDER — BUPIVACAINE LIPOSOME 1.3 % IJ SUSP
20.0000 mL | Freq: Once | INTRAMUSCULAR | Status: AC
  Filled 2018-08-10: qty 20

## 2018-08-11 ENCOUNTER — Other Ambulatory Visit: Payer: Self-pay

## 2018-08-11 ENCOUNTER — Encounter (HOSPITAL_COMMUNITY): Payer: Self-pay | Admitting: Emergency Medicine

## 2018-08-11 ENCOUNTER — Inpatient Hospital Stay (HOSPITAL_COMMUNITY): Payer: No Typology Code available for payment source | Admitting: Anesthesiology

## 2018-08-11 ENCOUNTER — Inpatient Hospital Stay (HOSPITAL_COMMUNITY)
Admission: RE | Admit: 2018-08-11 | Discharge: 2018-08-12 | DRG: 470 | Disposition: A | Discharge status: Home / Self Care | Payer: No Typology Code available for payment source | Attending: Orthopedic Surgery | Admitting: Orthopedic Surgery

## 2018-08-11 ENCOUNTER — Encounter (HOSPITAL_COMMUNITY)
Admission: RE | Disposition: A | Discharge status: Home / Self Care | Payer: Self-pay | Source: Home / Self Care | Attending: Orthopedic Surgery

## 2018-08-11 DIAGNOSIS — F4310 Post-traumatic stress disorder, unspecified: Secondary | ICD-10-CM | POA: Diagnosis present

## 2018-08-11 DIAGNOSIS — E669 Obesity, unspecified: Secondary | ICD-10-CM | POA: Diagnosis present

## 2018-08-11 DIAGNOSIS — M25761 Osteophyte, right knee: Secondary | ICD-10-CM | POA: Diagnosis present

## 2018-08-11 DIAGNOSIS — F419 Anxiety disorder, unspecified: Secondary | ICD-10-CM | POA: Diagnosis present

## 2018-08-11 DIAGNOSIS — G4730 Sleep apnea, unspecified: Secondary | ICD-10-CM | POA: Diagnosis present

## 2018-08-11 DIAGNOSIS — Z9089 Acquired absence of other organs: Secondary | ICD-10-CM | POA: Diagnosis not present

## 2018-08-11 DIAGNOSIS — Z6831 Body mass index (BMI) 31.0-31.9, adult: Secondary | ICD-10-CM | POA: Diagnosis not present

## 2018-08-11 DIAGNOSIS — N189 Chronic kidney disease, unspecified: Secondary | ICD-10-CM | POA: Diagnosis present

## 2018-08-11 DIAGNOSIS — F329 Major depressive disorder, single episode, unspecified: Secondary | ICD-10-CM | POA: Diagnosis present

## 2018-08-11 DIAGNOSIS — Z79899 Other long term (current) drug therapy: Secondary | ICD-10-CM

## 2018-08-11 DIAGNOSIS — K219 Gastro-esophageal reflux disease without esophagitis: Secondary | ICD-10-CM | POA: Diagnosis present

## 2018-08-11 DIAGNOSIS — Z96652 Presence of left artificial knee joint: Secondary | ICD-10-CM | POA: Diagnosis present

## 2018-08-11 DIAGNOSIS — M1711 Unilateral primary osteoarthritis, right knee: Principal | ICD-10-CM | POA: Diagnosis present

## 2018-08-11 DIAGNOSIS — M179 Osteoarthritis of knee, unspecified: Secondary | ICD-10-CM | POA: Diagnosis present

## 2018-08-11 DIAGNOSIS — M1710 Unilateral primary osteoarthritis, unspecified knee: Secondary | ICD-10-CM | POA: Diagnosis present

## 2018-08-11 HISTORY — PX: TOTAL KNEE ARTHROPLASTY: SHX125

## 2018-08-11 LAB — TYPE AND SCREEN
ABO/RH(D): A NEG
Antibody Screen: NEGATIVE

## 2018-08-11 SURGERY — ARTHROPLASTY, KNEE, TOTAL
Anesthesia: Spinal | Site: Knee | Laterality: Right

## 2018-08-11 MED ORDER — HYDROMORPHONE HCL 1 MG/ML IJ SOLN: 0.2500 mg | INTRAMUSCULAR | Status: DC | PRN

## 2018-08-11 MED ORDER — SODIUM CHLORIDE 0.9 % IJ SOLN
INTRAMUSCULAR | Status: AC
  Filled 2018-08-11: qty 50

## 2018-08-11 MED ORDER — MIDAZOLAM HCL 2 MG/2ML IJ SOLN
1.0000 mg | INTRAMUSCULAR | Status: DC
  Administered 2018-08-11: 2 mg via INTRAVENOUS
  Filled 2018-08-11: qty 2

## 2018-08-11 MED ORDER — GABAPENTIN 300 MG PO CAPS
300.0000 mg | ORAL_CAPSULE | Freq: Once | ORAL | Status: AC
  Administered 2018-08-11: 300 mg via ORAL
  Filled 2018-08-11: qty 1

## 2018-08-11 MED ORDER — SODIUM CHLORIDE 0.9 % IR SOLN
Status: DC | PRN
  Administered 2018-08-11: 1000 mL

## 2018-08-11 MED ORDER — SODIUM CHLORIDE 0.9 % IJ SOLN
INTRAMUSCULAR | Status: AC
  Filled 2018-08-11: qty 10

## 2018-08-11 MED ORDER — OXYCODONE HCL 5 MG PO TABS
5.0000 mg | ORAL_TABLET | ORAL | Status: DC | PRN
  Administered 2018-08-11: 5 mg via ORAL
  Administered 2018-08-11: 10 mg via ORAL
  Filled 2018-08-11: qty 1
  Filled 2018-08-11 (×2): qty 2

## 2018-08-11 MED ORDER — TRANEXAMIC ACID 1000 MG/10ML IV SOLN
1000.0000 mg | INTRAVENOUS | Status: AC
  Administered 2018-08-11: 1000 mg via INTRAVENOUS
  Filled 2018-08-11: qty 10

## 2018-08-11 MED ORDER — PANTOPRAZOLE SODIUM 40 MG PO TBEC
80.0000 mg | DELAYED_RELEASE_TABLET | Freq: Every day | ORAL | Status: DC
  Administered 2018-08-12: 80 mg via ORAL
  Filled 2018-08-11: qty 2

## 2018-08-11 MED ORDER — DEXAMETHASONE SODIUM PHOSPHATE 10 MG/ML IJ SOLN
INTRAMUSCULAR | Status: DC | PRN
  Administered 2018-08-11: 10 mg via INTRAVENOUS

## 2018-08-11 MED ORDER — TRANEXAMIC ACID 1000 MG/10ML IV SOLN
1000.0000 mg | Freq: Once | INTRAVENOUS | Status: AC
  Administered 2018-08-11: 1000 mg via INTRAVENOUS
  Filled 2018-08-11: qty 10

## 2018-08-11 MED ORDER — PROPOFOL 10 MG/ML IV BOLUS
INTRAVENOUS | Status: AC
  Filled 2018-08-11: qty 40

## 2018-08-11 MED ORDER — PROPOFOL 10 MG/ML IV BOLUS
INTRAVENOUS | Status: AC
  Filled 2018-08-11: qty 20

## 2018-08-11 MED ORDER — DOCUSATE SODIUM 100 MG PO CAPS
100.0000 mg | ORAL_CAPSULE | Freq: Two times a day (BID) | ORAL | Status: DC
  Administered 2018-08-11 – 2018-08-12 (×2): 100 mg via ORAL
  Filled 2018-08-11 (×2): qty 1

## 2018-08-11 MED ORDER — CHLORHEXIDINE GLUCONATE 4 % EX LIQD: 60.0000 mL | Freq: Once | CUTANEOUS | Status: DC

## 2018-08-11 MED ORDER — LACTATED RINGERS IV SOLN
INTRAVENOUS | Status: DC
  Administered 2018-08-11 (×2): via INTRAVENOUS

## 2018-08-11 MED ORDER — ACETAMINOPHEN 500 MG PO TABS
1000.0000 mg | ORAL_TABLET | Freq: Four times a day (QID) | ORAL | Status: AC
  Administered 2018-08-11 – 2018-08-12 (×4): 1000 mg via ORAL
  Filled 2018-08-11 (×4): qty 2

## 2018-08-11 MED ORDER — ROPIVACAINE HCL 7.5 MG/ML IJ SOLN
INTRAMUSCULAR | Status: DC | PRN
  Administered 2018-08-11: 20 mL via PERINEURAL

## 2018-08-11 MED ORDER — ONDANSETRON HCL 4 MG/2ML IJ SOLN
INTRAMUSCULAR | Status: DC | PRN
  Administered 2018-08-11: 4 mg via INTRAVENOUS

## 2018-08-11 MED ORDER — CLONIDINE HCL (ANALGESIA) 100 MCG/ML EP SOLN
EPIDURAL | Status: DC | PRN
  Administered 2018-08-11: 50 ug

## 2018-08-11 MED ORDER — FENTANYL CITRATE (PF) 100 MCG/2ML IJ SOLN
50.0000 ug | INTRAMUSCULAR | Status: DC
  Administered 2018-08-11: 50 ug via INTRAVENOUS
  Filled 2018-08-11: qty 2

## 2018-08-11 MED ORDER — ONDANSETRON HCL 4 MG/2ML IJ SOLN
INTRAMUSCULAR | Status: AC
  Filled 2018-08-11: qty 2

## 2018-08-11 MED ORDER — SODIUM CHLORIDE 0.9 % IJ SOLN
INTRAMUSCULAR | Status: DC | PRN
  Administered 2018-08-11: 60 mL

## 2018-08-11 MED ORDER — METOCLOPRAMIDE HCL 5 MG/ML IJ SOLN: 5.0000 mg | Freq: Three times a day (TID) | INTRAMUSCULAR | Status: DC | PRN

## 2018-08-11 MED ORDER — OXYCODONE HCL 5 MG PO TABS
10.0000 mg | ORAL_TABLET | ORAL | Status: DC | PRN
  Administered 2018-08-11: 10 mg via ORAL
  Administered 2018-08-12 (×4): 15 mg via ORAL
  Filled 2018-08-11 (×5): qty 3

## 2018-08-11 MED ORDER — CEFAZOLIN SODIUM-DEXTROSE 2-4 GM/100ML-% IV SOLN
2.0000 g | INTRAVENOUS | Status: AC
  Administered 2018-08-11: 2 g via INTRAVENOUS
  Filled 2018-08-11: qty 100

## 2018-08-11 MED ORDER — METHOCARBAMOL 500 MG IVPB - SIMPLE MED
500.0000 mg | Freq: Four times a day (QID) | INTRAVENOUS | Status: DC | PRN
  Filled 2018-08-11: qty 50

## 2018-08-11 MED ORDER — CEFAZOLIN SODIUM-DEXTROSE 2-4 GM/100ML-% IV SOLN
2.0000 g | Freq: Four times a day (QID) | INTRAVENOUS | Status: AC
  Administered 2018-08-11 (×2): 2 g via INTRAVENOUS
  Filled 2018-08-11 (×2): qty 100

## 2018-08-11 MED ORDER — BUPROPION HCL 100 MG PO TABS
100.0000 mg | ORAL_TABLET | Freq: Every day | ORAL | Status: DC
  Administered 2018-08-12: 100 mg via ORAL
  Filled 2018-08-11: qty 1

## 2018-08-11 MED ORDER — SODIUM CHLORIDE 0.9 % IV SOLN
INTRAVENOUS | Status: DC | PRN
  Administered 2018-08-11: 20 ug/min via INTRAVENOUS

## 2018-08-11 MED ORDER — BUPIVACAINE LIPOSOME 1.3 % IJ SUSP
INTRAMUSCULAR | Status: DC | PRN
  Administered 2018-08-11: 20 mL

## 2018-08-11 MED ORDER — DEXAMETHASONE SODIUM PHOSPHATE 10 MG/ML IJ SOLN: 8.0000 mg | Freq: Once | INTRAMUSCULAR | Status: AC

## 2018-08-11 MED ORDER — PHENYLEPHRINE HCL 10 MG/ML IJ SOLN
INTRAMUSCULAR | Status: AC
  Filled 2018-08-11: qty 1

## 2018-08-11 MED ORDER — FLEET ENEMA 7-19 GM/118ML RE ENEM: 1.0000 | ENEMA | Freq: Once | RECTAL | Status: DC | PRN

## 2018-08-11 MED ORDER — ACETAMINOPHEN 10 MG/ML IV SOLN
1000.0000 mg | Freq: Four times a day (QID) | INTRAVENOUS | Status: DC
  Administered 2018-08-11: 1000 mg via INTRAVENOUS
  Filled 2018-08-11: qty 100

## 2018-08-11 MED ORDER — ASPIRIN EC 325 MG PO TBEC
325.0000 mg | DELAYED_RELEASE_TABLET | Freq: Two times a day (BID) | ORAL | Status: DC
  Administered 2018-08-12: 325 mg via ORAL
  Filled 2018-08-11: qty 1

## 2018-08-11 MED ORDER — PROPOFOL 500 MG/50ML IV EMUL
INTRAVENOUS | Status: DC | PRN
  Administered 2018-08-11: 125 ug/kg/min via INTRAVENOUS

## 2018-08-11 MED ORDER — METHOCARBAMOL 500 MG PO TABS
500.0000 mg | ORAL_TABLET | Freq: Four times a day (QID) | ORAL | Status: DC | PRN
  Administered 2018-08-11 – 2018-08-12 (×3): 500 mg via ORAL
  Filled 2018-08-11 (×3): qty 1

## 2018-08-11 MED ORDER — HYDROMORPHONE HCL 1 MG/ML IJ SOLN
0.5000 mg | INTRAMUSCULAR | Status: DC | PRN
  Administered 2018-08-12 (×2): 0.5 mg via INTRAVENOUS
  Filled 2018-08-11 (×2): qty 1

## 2018-08-11 MED ORDER — METOCLOPRAMIDE HCL 5 MG PO TABS: 5.0000 mg | ORAL_TABLET | Freq: Three times a day (TID) | ORAL | Status: DC | PRN

## 2018-08-11 MED ORDER — BISACODYL 10 MG RE SUPP: 10.0000 mg | Freq: Every day | RECTAL | Status: DC | PRN

## 2018-08-11 MED ORDER — ONDANSETRON HCL 4 MG/2ML IJ SOLN: 4.0000 mg | Freq: Four times a day (QID) | INTRAMUSCULAR | Status: DC | PRN

## 2018-08-11 MED ORDER — PROMETHAZINE HCL 25 MG/ML IJ SOLN: 6.2500 mg | INTRAMUSCULAR | Status: DC | PRN

## 2018-08-11 MED ORDER — POLYETHYLENE GLYCOL 3350 17 G PO PACK: 17.0000 g | PACK | Freq: Every day | ORAL | Status: DC | PRN

## 2018-08-11 MED ORDER — DEXAMETHASONE SODIUM PHOSPHATE 10 MG/ML IJ SOLN
INTRAMUSCULAR | Status: AC
  Filled 2018-08-11: qty 1

## 2018-08-11 MED ORDER — VENLAFAXINE HCL ER 75 MG PO CP24
225.0000 mg | ORAL_CAPSULE | Freq: Every day | ORAL | Status: DC
  Administered 2018-08-12: 225 mg via ORAL
  Filled 2018-08-11: qty 1

## 2018-08-11 MED ORDER — DEXAMETHASONE SODIUM PHOSPHATE 10 MG/ML IJ SOLN
10.0000 mg | Freq: Once | INTRAMUSCULAR | Status: AC
  Administered 2018-08-12: 10 mg via INTRAVENOUS
  Filled 2018-08-11: qty 1

## 2018-08-11 MED ORDER — STERILE WATER FOR IRRIGATION IR SOLN
Status: DC | PRN
  Administered 2018-08-11: 2000 mL

## 2018-08-11 MED ORDER — SODIUM CHLORIDE 0.9 % IV SOLN
INTRAVENOUS | Status: DC
  Administered 2018-08-11 – 2018-08-12 (×2): via INTRAVENOUS

## 2018-08-11 MED ORDER — DIPHENHYDRAMINE HCL 12.5 MG/5ML PO ELIX: 12.5000 mg | ORAL_SOLUTION | ORAL | Status: DC | PRN

## 2018-08-11 MED ORDER — ONDANSETRON HCL 4 MG PO TABS: 4.0000 mg | ORAL_TABLET | Freq: Four times a day (QID) | ORAL | Status: DC | PRN

## 2018-08-11 MED ORDER — GABAPENTIN 300 MG PO CAPS
300.0000 mg | ORAL_CAPSULE | Freq: Three times a day (TID) | ORAL | Status: DC
  Administered 2018-08-11 – 2018-08-12 (×4): 300 mg via ORAL
  Filled 2018-08-11 (×4): qty 1

## 2018-08-11 MED ORDER — PHENOL 1.4 % MT LIQD: 1.0000 | OROMUCOSAL | Status: DC | PRN

## 2018-08-11 MED ORDER — BUPIVACAINE IN DEXTROSE 0.75-8.25 % IT SOLN
INTRATHECAL | Status: DC | PRN
  Administered 2018-08-11: 1.8 mL via INTRATHECAL

## 2018-08-11 MED ORDER — PHENYLEPHRINE 40 MCG/ML (10ML) SYRINGE FOR IV PUSH (FOR BLOOD PRESSURE SUPPORT)
PREFILLED_SYRINGE | INTRAVENOUS | Status: DC | PRN
  Administered 2018-08-11 (×5): 80 ug via INTRAVENOUS

## 2018-08-11 MED ORDER — MENTHOL 3 MG MT LOZG: 1.0000 | LOZENGE | OROMUCOSAL | Status: DC | PRN

## 2018-08-11 SURGICAL SUPPLY — 63 items
ATTUNE MED DOME PAT 41 KNEE (Knees) ×2 IMPLANT
ATTUNE MED DOME PAT 41MM KNEE (Knees) ×1 IMPLANT
ATTUNE PS FEM RT SZ 8 CEM KNEE (Femur) ×3 IMPLANT
ATTUNE PSRP INSR SZ8 8 KNEE (Insert) ×4 IMPLANT
ATTUNE PSRP INSR SZ8 8MM KNEE (Insert) ×2 IMPLANT
BAG ZIPLOCK 12X15 (MISCELLANEOUS) ×3 IMPLANT
BANDAGE ACE 6X5 VEL STRL LF (GAUZE/BANDAGES/DRESSINGS) ×3 IMPLANT
BASE TIBIAL ATTUNE KNEE SZ9 (Knees) ×1 IMPLANT
BLADE SAG 18X100X1.27 (BLADE) ×3 IMPLANT
BLADE SAW SGTL 11.0X1.19X90.0M (BLADE) ×3 IMPLANT
BOWL SMART MIX CTS (DISPOSABLE) ×3 IMPLANT
CEMENT HV SMART SET (Cement) ×6 IMPLANT
CLOSURE WOUND 1/2 X4 (GAUZE/BANDAGES/DRESSINGS) ×1
COVER SURGICAL LIGHT HANDLE (MISCELLANEOUS) ×3 IMPLANT
CUFF TOURN SGL QUICK 34 (TOURNIQUET CUFF) ×2
CUFF TRNQT CYL 34X4X40X1 (TOURNIQUET CUFF) ×1 IMPLANT
DECANTER SPIKE VIAL GLASS SM (MISCELLANEOUS) ×6 IMPLANT
DRAPE U-SHAPE 47X51 STRL (DRAPES) ×3 IMPLANT
DRSG ADAPTIC 3X8 NADH LF (GAUZE/BANDAGES/DRESSINGS) ×3 IMPLANT
DRSG PAD ABDOMINAL 8X10 ST (GAUZE/BANDAGES/DRESSINGS) ×3 IMPLANT
DURAPREP 26ML APPLICATOR (WOUND CARE) ×3 IMPLANT
ELECT REM PT RETURN 15FT ADLT (MISCELLANEOUS) ×3 IMPLANT
EVACUATOR 1/8 PVC DRAIN (DRAIN) ×3 IMPLANT
GAUZE SPONGE 4X4 12PLY STRL (GAUZE/BANDAGES/DRESSINGS) ×3 IMPLANT
GLOVE BIO SURGEON STRL SZ7 (GLOVE) ×3 IMPLANT
GLOVE BIO SURGEON STRL SZ7.5 (GLOVE) ×3 IMPLANT
GLOVE BIO SURGEON STRL SZ8 (GLOVE) ×6 IMPLANT
GLOVE BIOGEL PI IND STRL 6.5 (GLOVE) IMPLANT
GLOVE BIOGEL PI IND STRL 7.0 (GLOVE) ×6 IMPLANT
GLOVE BIOGEL PI IND STRL 7.5 (GLOVE) ×1 IMPLANT
GLOVE BIOGEL PI IND STRL 8 (GLOVE) ×1 IMPLANT
GLOVE BIOGEL PI INDICATOR 6.5 (GLOVE)
GLOVE BIOGEL PI INDICATOR 7.0 (GLOVE) ×12
GLOVE BIOGEL PI INDICATOR 7.5 (GLOVE) ×2
GLOVE BIOGEL PI INDICATOR 8 (GLOVE) ×2
GLOVE SURG SS PI 6.5 STRL IVOR (GLOVE) IMPLANT
GLOVE SURG SS PI 7.0 STRL IVOR (GLOVE) ×3 IMPLANT
GOWN SPEC L4 XLG W/TWL (GOWN DISPOSABLE) ×6 IMPLANT
GOWN STRL REUS W/TWL LRG LVL3 (GOWN DISPOSABLE) ×6 IMPLANT
HANDPIECE INTERPULSE COAX TIP (DISPOSABLE) ×2
HOLDER FOLEY CATH W/STRAP (MISCELLANEOUS) IMPLANT
IMMOBILIZER KNEE 20 (SOFTGOODS) ×3
IMMOBILIZER KNEE 20 THIGH 36 (SOFTGOODS) ×1 IMPLANT
MANIFOLD NEPTUNE II (INSTRUMENTS) ×3 IMPLANT
NS IRRIG 1000ML POUR BTL (IV SOLUTION) ×3 IMPLANT
PACK TOTAL KNEE CUSTOM (KITS) ×3 IMPLANT
PAD ABD 8X10 STRL (GAUZE/BANDAGES/DRESSINGS) ×3 IMPLANT
PADDING CAST COTTON 6X4 STRL (CAST SUPPLIES) ×3 IMPLANT
PIN STEINMAN FIXATION KNEE (PIN) ×3 IMPLANT
PIN THREADED HEADED SIGMA (PIN) ×3 IMPLANT
POSITIONER SURGICAL ARM (MISCELLANEOUS) ×3 IMPLANT
SET HNDPC FAN SPRY TIP SCT (DISPOSABLE) ×1 IMPLANT
STRIP CLOSURE SKIN 1/2X4 (GAUZE/BANDAGES/DRESSINGS) ×2 IMPLANT
SUT MNCRL AB 4-0 PS2 18 (SUTURE) ×3 IMPLANT
SUT STRATAFIX 0 PDS 27 VIOLET (SUTURE) ×3
SUT VIC AB 2-0 CT1 27 (SUTURE) ×6
SUT VIC AB 2-0 CT1 TAPERPNT 27 (SUTURE) ×3 IMPLANT
SUTURE STRATFX 0 PDS 27 VIOLET (SUTURE) ×1 IMPLANT
TIBIAL BASE ATTUNE KNEE SZ9 (Knees) ×3 IMPLANT
TRAY FOLEY MTR SLVR 16FR STAT (SET/KITS/TRAYS/PACK) ×3 IMPLANT
WATER STERILE IRR 1000ML POUR (IV SOLUTION) ×6 IMPLANT
WRAP KNEE MAXI GEL POST OP (GAUZE/BANDAGES/DRESSINGS) ×3 IMPLANT
YANKAUER SUCT BULB TIP 10FT TU (MISCELLANEOUS) ×3 IMPLANT

## 2018-08-11 NOTE — Progress Notes (Signed)
Assisted Dr. Rose with right, ultrasound guided, adductor canal block. Side rails up, monitors on throughout procedure. See vital signs in flow sheet. Tolerated Procedure well.  

## 2018-08-11 NOTE — Op Note (Signed)
OPERATIVE REPORT-TOTAL KNEE ARTHROPLASTY   Pre-operative diagnosis- Osteoarthritis  Right knee(s)  Post-operative diagnosis- Osteoarthritis Right knee(s)  Procedure-  Right  Total Knee Arthroplasty (Depuy Attune)  Surgeon- Nicholas Rankin. Jezel Basto, MD  Assistant- Arther Abbott, PA-C   Anesthesia- Adductor canal block and spinal  EBL-20 mL   Drains Hemovac  Tourniquet time-  Total Tourniquet Time Documented: Thigh (Right) - 47 minutes Total: Thigh (Right) - 47 minutes     Complications- None  Condition-PACU - hemodynamically stable.   Brief Clinical Note  Nicholas Nash is a 47 y.o. year old male with end stage OA of his right knee with progressively worsening pain and dysfunction. He has constant pain, with activity and at rest and significant functional deficits with difficulties even with ADLs. He has had extensive non-op management including analgesics, injections of cortisone, and home exercise program, but remains in significant pain with significant dysfunction. Radiographs show bone on bone arthritis medial and patellofemoral. He presents now for right Total Knee Arthroplasty.    Procedure in detail---   The patient is brought into the operating room and positioned supine on the operating table. After successful administration of  Adductor canal block and spinal,   a tourniquet is placed high on the  Right thigh(s) and the lower extremity is prepped and draped in the usual sterile fashion. Time out is performed by the operating team and then the  Right lower extremity is wrapped in Esmarch, knee flexed and the tourniquet inflated to 300 mmHg.       A midline incision is made with a ten blade through the subcutaneous tissue to the level of the extensor mechanism. A fresh blade is used to make a medial parapatellar arthrotomy. Soft tissue over the proximal medial tibia is subperiosteally elevated to the joint line with a knife and into the semimembranosus bursa with a Cobb  elevator. Soft tissue over the proximal lateral tibia is elevated with attention being paid to avoiding the patellar tendon on the tibial tubercle. The patella is everted, knee flexed 90 degrees and the ACL and PCL are removed. Findings are bone on bone medial and patellofemoral with large global osteophytes.        The drill is used to create a starting hole in the distal femur and the canal is thoroughly irrigated with sterile saline to remove the fatty contents. The 5 degree Right  valgus alignment guide is placed into the femoral canal and the distal femoral cutting block is pinned to remove 9 mm off the distal femur. Resection is made with an oscillating saw.      The tibia is subluxed forward and the menisci are removed. The extramedullary alignment guide is placed referencing proximally at the medial aspect of the tibial tubercle and distally along the second metatarsal axis and tibial crest. The block is pinned to remove 2mm off the more deficient medial  side. Resection is made with an oscillating saw. Size 9is the most appropriate size for the tibia and the proximal tibia is prepared with the modular drill and keel punch for that size.      The femoral sizing guide is placed and size 8 is most appropriate. Rotation is marked off the epicondylar axis and confirmed by creating a rectangular flexion gap at 90 degrees. The size 8 cutting block is pinned in this rotation and the anterior, posterior and chamfer cuts are made with the oscillating saw. The intercondylar block is then placed and that cut is made.  Trial size 9 tibial component, trial size 8 posterior stabilized femur and a 8  mm posterior stabilized rotating platform insert trial is placed. Full extension is achieved with excellent varus/valgus and anterior/posterior balance throughout full range of motion. The patella is everted and thickness measured to be 27  mm. Free hand resection is taken to 15 mm, a 41 template is placed, lug holes  are drilled, trial patella is placed, and it tracks normally. Osteophytes are removed off the posterior femur with the trial in place. All trials are removed and the cut bone surfaces prepared with pulsatile lavage. Cement is mixed and once ready for implantation, the size 9 tibial implant, size  8 posterior stabilized femoral component, and the size 41 patella are cemented in place and the patella is held with the clamp. The trial insert is placed and the knee held in full extension. The Exparel (20 ml mixed with 60 ml saline) is injected into the extensor mechanism, posterior capsule, medial and lateral gutters and subcutaneous tissues.  All extruded cement is removed and once the cement is hard the permanent 8 mm posterior stabilized rotating platform insert is placed into the tibial tray.      The wound is copiously irrigated with saline solution and the extensor mechanism closed over a hemovac drain with #1 V-loc suture. The tourniquet is released for a total tourniquet time of 46  minutes. Flexion against gravity is 140 degrees and the patella tracks normally. Subcutaneous tissue is closed with 2.0 vicryl and subcuticular with running 4.0 Monocryl. The incision is cleaned and dried and steri-strips and a bulky sterile dressing are applied. The limb is placed into a knee immobilizer and the patient is awakened and transported to recovery in stable condition.      Please note that a surgical assistant was a medical necessity for this procedure in order to perform it in a safe and expeditious manner. Surgical assistant was necessary to retract the ligaments and vital neurovascular structures to prevent injury to them and also necessary for proper positioning of the limb to allow for anatomic placement of the prosthesis.   Nicholas Rankin Bellamy Rubey, MD    08/11/2018, 10:29 AM

## 2018-08-11 NOTE — Evaluation (Signed)
Physical Therapy Evaluation Patient Details Name: Nicholas Nash MRN: [id number] DOB: 1971-07-30 Today's Date: 08/11/2018   History of Present Illness  Right TKA, H/O LTKA 10/12, and 2 back surgeries  Clinical Impression  The patient is progressing well. Catheter is most discomfort.  Pt admitted with above diagnosis. Pt currently with functional limitations due to the deficits listed below (see PT Problem List).  Pt will benefit from skilled PT to increase their independence and safety with mobility to allow discharge to the venue listed below.       Follow Up Recommendations Follow surgeon's recommendation for DC plan and follow-up therapies;Outpatient PT    Equipment Recommendations  None recommended by PT    Recommendations for Other Services       Precautions / Restrictions Precautions Precautions: Knee;Fall Required Braces or Orthoses: Knee Immobilizer - Right Knee Immobilizer - Right: Discontinue once straight leg raise with < 10 degree lag      Mobility  Bed Mobility Overal bed mobility: Needs Assistance Bed Mobility: Supine to Sit;Sit to Supine     Supine to sit: Supervision Sit to supine: Supervision   General bed mobility comments: manages  right leg  Transfers Overall transfer level: Needs assistance Equipment used: Rolling walker (2 wheeled) Transfers: Sit to/from Stand Sit to Stand: Min assist         General transfer comment: cues for hand placement  Ambulation/Gait Ambulation/Gait assistance: Min assist Gait Distance (Feet): 90 Feet Assistive device: Rolling walker (2 wheeled) Gait Pattern/deviations: Step-through pattern     General Gait Details: cues for sequence  Stairs            Wheelchair Mobility    Modified Rankin (Stroke Patients Only)       Balance                                             Pertinent Vitals/Pain Pain Assessment: 0-10 Pain Location: right knee Pain Descriptors / Indicators:  Sore Pain Intervention(s): Monitored during session;Premedicated before session;Ice applied    Home Living Family/patient expects to be discharged to:: Private residence Living Arrangements: Spouse/significant other;Children Available Help at Discharge: Family Type of Home: House Home Access: Stairs to enter Entrance Stairs-Rails: Lawyer of Steps: 4 Home Layout: Two level;Able to live on main level with bedroom/bathroom Home Equipment: Dan Humphreys - 2 wheels;Cane - single point      Prior Function Level of Independence: Independent               Hand Dominance        Extremity/Trunk Assessment   Upper Extremity Assessment Upper Extremity Assessment: Overall WFL for tasks assessed    Lower Extremity Assessment Lower Extremity Assessment: RLE deficits/detail RLE Deficits / Details: + SLR    Cervical / Trunk Assessment Cervical / Trunk Assessment: Normal  Communication      Cognition Arousal/Alertness: Awake/alert Behavior During Therapy: WFL for tasks assessed/performed Overall Cognitive Status: Within Functional Limits for tasks assessed                                        General Comments      Exercises Total Joint Exercises Quad Sets: AROM;Right;10 reps   Assessment/Plan    PT Assessment Patient needs continued PT services  PT Problem  List Decreased strength;Decreased range of motion;Decreased activity tolerance;Decreased mobility;Decreased knowledge of precautions;Decreased safety awareness;Decreased knowledge of use of DME;Decreased cognition       PT Treatment Interventions DME instruction;Therapeutic exercise;Gait training;Stair training;Functional mobility training;Therapeutic activities;Patient/family education    PT Goals (Current goals can be found in the Care Plan section)  Acute Rehab PT Goals Patient Stated Goal: to go home PT Goal Formulation: With patient Time For Goal Achievement:  08/15/18 Potential to Achieve Goals: Good    Frequency 7X/week   Barriers to discharge        Co-evaluation               AM-PAC PT "6 Clicks" Daily Activity  Outcome Measure Difficulty turning over in bed (including adjusting bedclothes, sheets and blankets)?: None Difficulty moving from lying on back to sitting on the side of the bed? : None Difficulty sitting down on and standing up from a chair with arms (e.g., wheelchair, bedside commode, etc,.)?: A Little Help needed moving to and from a bed to chair (including a wheelchair)?: A Little Help needed walking in hospital room?: A Little Help needed climbing 3-5 steps with a railing? : A Lot 6 Click Score: 19    End of Session Equipment Utilized During Treatment: Right knee immobilizer Activity Tolerance: Patient tolerated treatment well Patient left: with call bell/phone within reach;in bed Nurse Communication: Mobility status PT Visit Diagnosis: Unsteadiness on feet (R26.81);Pain Pain - Right/Left: Right Pain - part of body: Knee    Time: 1537-1610 PT Time Calculation (min) (ACUTE ONLY): 33 min   Charges:   PT Evaluation $PT Eval Low Complexity: 1 Low PT Treatments $Gait Training: 8-22 mins        [facility] [phone]   [name] 08/11/2018, 5:05 PM

## 2018-08-11 NOTE — Anesthesia Procedure Notes (Signed)
Anesthesia Regional Block: Adductor canal block   Pre-Anesthetic Checklist: ,, timeout performed, Correct Patient, Correct Site, Correct Laterality, Correct Procedure, Correct Position, site marked, Risks and benefits discussed,  Surgical consent,  Pre-op evaluation,  At surgeon's request and post-op pain management  Laterality: Right  Prep: chloraprep       Needles:  Injection technique: Single-shot  Needle Type: Stimiplex     Needle Length: 9cm      Additional Needles:   Procedures:,,,, ultrasound used (permanent image in chart),,,,  Narrative:  Start time: 08/11/2018 8:32 AM End time: 08/11/2018 8:40 AM Injection made incrementally with aspirations every 5 mL.  Performed by: Personally  Anesthesiologist: Eilene Ghazi, MD  Additional Notes: Patient tolerated the procedure well without complications

## 2018-08-11 NOTE — Discharge Instructions (Signed)
° °Dr. Frank Aluisio °Total Joint Specialist °[facility] °[address] °[place], [place] [address] °[phone] ° °TOTAL KNEE REPLACEMENT POSTOPERATIVE DIRECTIONS ° °Knee Rehabilitation, Guidelines Following Surgery  °Results after knee surgery are often greatly improved when you follow the exercise, range of motion and muscle strengthening exercises prescribed by your doctor. Safety measures are also important to protect the knee from further injury. Any time any of these exercises cause you to have increased pain or swelling in your knee joint, decrease the amount until you are comfortable again and slowly increase them. If you have problems or questions, call your caregiver or physical therapist for advice.  ° °HOME CARE INSTRUCTIONS  °Remove items at home which could result in a fall. This includes throw rugs or furniture in walking pathways.  °· ICE to the affected knee every three hours for 30 minutes at a time and then as needed for pain and swelling.  Continue to use ice on the knee for pain and swelling from surgery. You may notice swelling that will progress down to the foot and ankle.  This is normal after surgery.  Elevate the leg when you are not up walking on it.   °· Continue to use the breathing machine which will help keep your temperature down.  It is common for your temperature to cycle up and down following surgery, especially at night when you are not up moving around and exerting yourself.  The breathing machine keeps your lungs expanded and your temperature down. °· Do not place pillow under knee, focus on keeping the knee straight while resting ° °DIET °You may resume your previous home diet once your are discharged from the hospital. ° °DRESSING / WOUND CARE / SHOWERING °You may change your dressing every day with sterile gauze.  Please use good hand washing techniques before changing the dressing.  Do not use any lotions or creams on the incision until instructed by your  surgeon. °You may start showering once you are discharged home but do not submerge the incision under water. Just pat the incision dry and apply a dry gauze dressing on daily. °Change the surgical dressing daily and reapply a dry dressing each time. ° °ACTIVITY °Walk with your walker as instructed. °Use walker as long as suggested by your caregivers. °Avoid periods of inactivity such as sitting longer than an hour when not asleep. This helps prevent blood clots.  °You may resume a sexual relationship in one month or when given the OK by your doctor.  °You may return to work once you are cleared by your doctor.  °Do not drive a car for 6 weeks or until released by you surgeon.  °Do not drive while taking narcotics. ° °WEIGHT BEARING °Weight bearing as tolerated with assist device (walker, cane, etc) as directed, use it as long as suggested by your surgeon or therapist, typically at least 4-6 weeks. ° °POSTOPERATIVE CONSTIPATION PROTOCOL °Constipation - defined medically as fewer than three stools per week and severe constipation as less than one stool per week. ° °One of the most common issues patients have following surgery is constipation.  Even if you have a regular bowel pattern at home, your normal regimen is likely to be disrupted due to multiple reasons following surgery.  Combination of anesthesia, postoperative narcotics, change in appetite and fluid intake all can affect your bowels.  In order to avoid complications following surgery, here are some recommendations in order to help you during your recovery period. ° °  Colace (docusate) - Pick up an over-the-counter form of Colace or another stool softener and take twice a day as long as you are requiring postoperative pain medications.  Take with a full glass of water daily.  If you experience loose stools or diarrhea, hold the colace until you stool forms back up.  If your symptoms do not get better within 1 week or if they get worse, check with your  doctor. ° °Dulcolax (bisacodyl) - Pick up over-the-counter and take as directed by the product packaging as needed to assist with the movement of your bowels.  Take with a full glass of water.  Use this product as needed if not relieved by Colace only.  ° °MiraLax (polyethylene glycol) - Pick up over-the-counter to have on hand.  MiraLax is a solution that will increase the amount of water in your bowels to assist with bowel movements.  Take as directed and can mix with a glass of water, juice, soda, coffee, or tea.  Take if you go more than two days without a movement. °Do not use MiraLax more than once per day. Call your doctor if you are still constipated or irregular after using this medication for 7 days in a row. ° °If you continue to have problems with postoperative constipation, please contact the office for further assistance and recommendations.  If you experience "the worst abdominal pain ever" or develop nausea or vomiting, please contact the office immediatly for further recommendations for treatment. ° °ITCHING ° If you experience itching with your medications, try taking only a single pain pill, or even half a pain pill at a time.  You can also use Benadryl over the counter for itching or also to help with sleep.  ° °TED HOSE STOCKINGS °Wear the elastic stockings on both legs for three weeks following surgery during the day but you may remove then at night for sleeping. ° °MEDICATIONS °See your medication summary on the “After Visit Summary” that the nursing staff will review with you prior to discharge.  You may have some home medications which will be placed on hold until you complete the course of blood thinner medication.  It is important for you to complete the blood thinner medication as prescribed by your surgeon.  Continue your approved medications as instructed at time of discharge. ° °PRECAUTIONS °If you experience chest pain or shortness of breath - call 911 immediately for transfer to the  hospital emergency department.  °If you develop a fever greater that 101 F, purulent drainage from wound, increased redness or drainage from wound, foul odor from the wound/dressing, or calf pain - CONTACT YOUR SURGEON.   °                                                °FOLLOW-UP APPOINTMENTS °Make sure you keep all of your appointments after your operation with your surgeon and caregivers. You should call the office at the above phone number and make an appointment for approximately two weeks after the date of your surgery or on the date instructed by your surgeon outlined in the "After Visit Summary". ° ° °RANGE OF MOTION AND STRENGTHENING EXERCISES  °Rehabilitation of the knee is important following a knee injury or an operation. After just a few days of immobilization, the muscles of the thigh which control the knee become weakened and   shrink (atrophy). Knee exercises are designed to build up the tone and strength of the thigh muscles and to improve knee motion. Often times heat used for twenty to thirty minutes before working out will loosen up your tissues and help with improving the range of motion but do not use heat for the first two weeks following surgery. These exercises can be done on a training (exercise) mat, on the floor, on a table or on a bed. Use what ever works the best and is most comfortable for you Knee exercises include:  °Leg Lifts - While your knee is still immobilized in a splint or cast, you can do straight leg raises. Lift the leg to 60 degrees, hold for 3 sec, and slowly lower the leg. Repeat 10-20 times 2-3 times daily. Perform this exercise against resistance later as your knee gets better.  °Quad and Hamstring Sets - Tighten up the muscle on the front of the thigh (Quad) and hold for 5-10 sec. Repeat this 10-20 times hourly. Hamstring sets are done by pushing the foot backward against an object and holding for 5-10 sec. Repeat as with quad sets.  °· Leg Slides: Lying on your back,  slowly slide your foot toward your buttocks, bending your knee up off the floor (only go as far as is comfortable). Then slowly slide your foot back down until your leg is flat on the floor again. °· Angel Wings: Lying on your back spread your legs to the side as far apart as you can without causing discomfort.  °A rehabilitation program following serious knee injuries can speed recovery and prevent re-injury in the future due to weakened muscles. Contact your doctor or a physical therapist for more information on knee rehabilitation.  ° °IF YOU ARE TRANSFERRED TO A SKILLED REHAB FACILITY °If the patient is transferred to a skilled rehab facility following release from the hospital, a list of the current medications will be sent to the facility for the patient to continue.  When discharged from the skilled rehab facility, please have the facility set up the patient's Home Health Physical Therapy prior to being released. Also, the skilled facility will be responsible for providing the patient with their medications at time of release from the facility to include their pain medication, the muscle relaxants, and their blood thinner medication. If the patient is still at the rehab facility at time of the two week follow up appointment, the skilled rehab facility will also need to assist the patient in arranging follow up appointment in our office and any transportation needs. ° °MAKE SURE YOU:  °Understand these instructions.  °Get help right away if you are not doing well or get worse.  ° ° °Pick up stool softner and laxative for home use following surgery while on pain medications. °Do not submerge incision under water. °Please use good hand washing techniques while changing dressing each day. °May shower starting three days after surgery. °Please use a clean towel to pat the incision dry following showers. °Continue to use ice for pain and swelling after surgery. °Do not use any lotions or creams on the incision  until instructed by your surgeon. °

## 2018-08-11 NOTE — Anesthesia Procedure Notes (Signed)
Spinal  Patient location during procedure: OR Start time: 08/11/2018 9:13 AM End time: 08/11/2018 9:15 AM Staffing Resident/CRNA: Lind Covert, CRNA Performed: resident/CRNA  Preanesthetic Checklist Completed: patient identified, site marked, surgical consent, pre-op evaluation, timeout performed, IV checked, risks and benefits discussed and monitors and equipment checked Spinal Block Patient position: sitting Prep: DuraPrep Patient monitoring: cardiac monitor, heart rate, continuous pulse ox and blood pressure Approach: midline Location: L3-4 Injection technique: single-shot Needle Needle type: Pencan  Needle length: 10 cm Needle insertion depth: 7 cm Assessment Sensory level: T6 Additional Notes Timeout performed. SAB kit date checked. SAB without difficulty

## 2018-08-11 NOTE — Anesthesia Preprocedure Evaluation (Signed)
Anesthesia Evaluation  Patient identified by MRN, date of birth, ID band Patient awake    Reviewed: Allergy & Precautions, NPO status , Patient's Chart, lab work & pertinent test results  Airway Mallampati: II  TM Distance: >3 FB Neck ROM: Full    Dental no notable dental hx.    Pulmonary sleep apnea ,    Pulmonary exam normal breath sounds clear to auscultation       Cardiovascular negative cardio ROS Normal cardiovascular exam Rhythm:Regular Rate:Normal     Neuro/Psych negative neurological ROS  negative psych ROS   GI/Hepatic Neg liver ROS, GERD  ,  Endo/Other  negative endocrine ROS  Renal/GU negative Renal ROS  negative genitourinary   Musculoskeletal negative musculoskeletal ROS (+)   Abdominal   Peds negative pediatric ROS (+)  Hematology negative hematology ROS (+)   Anesthesia Other Findings   Reproductive/Obstetrics negative OB ROS                             Anesthesia Physical Anesthesia Plan  ASA: II  Anesthesia Plan: Spinal   Post-op Pain Management:  Regional for Post-op pain   Induction: Intravenous  PONV Risk Score and Plan: 2 and Ondansetron, Dexamethasone and Treatment may vary due to age or medical condition  Airway Management Planned: Simple Face Mask  Additional Equipment:   Intra-op Plan:   Post-operative Plan:   Informed Consent: I have reviewed the patients History and Physical, chart, labs and discussed the procedure including the risks, benefits and alternatives for the proposed anesthesia with the patient or authorized representative who has indicated his/her understanding and acceptance.   Dental advisory given  Plan Discussed with: CRNA and Surgeon  Anesthesia Plan Comments:         Anesthesia Quick Evaluation

## 2018-08-11 NOTE — Anesthesia Postprocedure Evaluation (Signed)
Anesthesia Post Note  Patient: Nicholas Nash  Procedure(s) Performed: RIGHT TOTAL KNEE ARTHROPLASTY (Right Knee)     Patient location during evaluation: PACU Anesthesia Type: Spinal Level of consciousness: oriented and awake and alert Pain management: pain level controlled Vital Signs Assessment: post-procedure vital signs reviewed and stable Respiratory status: spontaneous breathing, respiratory function stable and patient connected to nasal cannula oxygen Cardiovascular status: blood pressure returned to baseline and stable Postop Assessment: no headache, no backache and no apparent nausea or vomiting Anesthetic complications: no    Last Vitals:  Vitals:   08/11/18 1145 08/11/18 1200  BP: 102/69 121/60  Pulse: (!) 58 66  Resp: 12 19  Temp:    SpO2: 96% 100%    Last Pain:  Vitals:   08/11/18 1200  TempSrc:   PainSc: 0-No pain    LLE Motor Response: Purposeful movement;Responds to commands (08/11/18 1200) LLE Sensation: Numbness (08/11/18 1200) RLE Motor Response: Purposeful movement;Responds to commands (08/11/18 1200) RLE Sensation: Numbness (08/11/18 1200) L Sensory Level: S1-Sole of foot, small toes (08/11/18 1200) R Sensory Level: S1-Sole of foot, small toes (08/11/18 1200)  Comfort Iversen S

## 2018-08-11 NOTE — Interval H&P Note (Signed)
History and Physical Interval Note:  08/11/2018 7:18 AM  Nicholas Nash  has presented today for surgery, with the diagnosis of right knee osteoarthritis  The various methods of treatment have been discussed with the patient and family. After consideration of risks, benefits and other options for treatment, the patient has consented to  Procedure(s): RIGHT TOTAL KNEE ARTHROPLASTY (Right) as a surgical intervention .  The patient's history has been reviewed, patient examined, no change in status, stable for surgery.  I have reviewed the patient's chart and labs.  Questions were answered to the patient's satisfaction.     Homero Fellers Hurman Ketelsen

## 2018-08-11 NOTE — Transfer of Care (Signed)
Immediate Anesthesia Transfer of Care Note  Patient: Nicholas Nash  Procedure(s) Performed: RIGHT TOTAL KNEE ARTHROPLASTY (Right Knee)  Patient Location: PACU  Anesthesia Type:Regional and Spinal  Level of Consciousness: sedated  Airway & Oxygen Therapy: Patient Spontanous Breathing and Patient connected to face mask oxygen  Post-op Assessment: Report given to RN and Post -op Vital signs reviewed and stable  Post vital signs: Reviewed and stable  Last Vitals:  Vitals Value Taken Time  BP    Temp    Pulse 68 08/11/2018 10:59 AM  Resp 14 08/11/2018 10:59 AM  SpO2 97 % 08/11/2018 10:59 AM  Vitals shown include unvalidated device data.  Last Pain:  Vitals:   08/11/18 0711  TempSrc:   PainSc: 4       Patients Stated Pain Goal: 4 (08/11/18 0711)  Complications: No apparent anesthesia complications

## 2018-08-11 NOTE — Anesthesia Procedure Notes (Signed)
Anesthesia Procedure Image    

## 2018-08-12 ENCOUNTER — Encounter (HOSPITAL_COMMUNITY): Payer: Self-pay | Admitting: Orthopedic Surgery

## 2018-08-12 LAB — BASIC METABOLIC PANEL
ANION GAP: 11 (ref 5–15)
BUN: 14 mg/dL (ref 6–20)
CO2: 23 mmol/L (ref 22–32)
Calcium: 8.6 mg/dL — ABNORMAL LOW (ref 8.9–10.3)
Chloride: 105 mmol/L (ref 98–111)
Creatinine, Ser: 1.3 mg/dL — ABNORMAL HIGH (ref 0.61–1.24)
GFR calc Af Amer: >60 mL/min (ref >60)
Glucose, Bld: 143 mg/dL — ABNORMAL HIGH (ref 70–99)
Potassium: 3.7 mmol/L (ref 3.5–5.1)
SODIUM: 139 mmol/L (ref 135–145)

## 2018-08-12 LAB — CBC
HCT: 40.2 % (ref 39.0–52.0)
Hemoglobin: 13.4 g/dL (ref 13.0–17.0)
MCH: 30 pg (ref 26.0–34.0)
MCHC: 33.3 g/dL (ref 30.0–36.0)
MCV: 89.9 fL (ref 78.0–100.0)
PLATELETS: 300 10*3/uL (ref 150–400)
RBC: 4.47 MIL/uL (ref 4.22–5.81)
RDW: 12.9 % (ref 11.5–15.5)
WBC: 15.3 10*3/uL — AB (ref 4.0–10.5)

## 2018-08-12 MED ORDER — OXYCODONE HCL 5 MG PO TABS: 5.0000 mg | ORAL_TABLET | Freq: Four times a day (QID) | ORAL | 0 refills | Status: DC | PRN

## 2018-08-12 MED ORDER — METHOCARBAMOL 500 MG PO TABS: 500.0000 mg | ORAL_TABLET | Freq: Four times a day (QID) | ORAL | 0 refills | Status: DC | PRN

## 2018-08-12 MED ORDER — GABAPENTIN 300 MG PO CAPS: 300.0000 mg | ORAL_CAPSULE | Freq: Three times a day (TID) | ORAL | 0 refills | Status: AC

## 2018-08-12 MED ORDER — ASPIRIN 325 MG PO TBEC: 325.0000 mg | DELAYED_RELEASE_TABLET | Freq: Two times a day (BID) | ORAL | 0 refills | Status: AC

## 2018-08-12 NOTE — Progress Notes (Signed)
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 Plans Dc after PM PT.   Follow Up Recommendations  Follow surgeon's recommendation for DC plan and follow-up therapies;Outpatient PT     Equipment Recommendations  None recommended by PT    Recommendations for Other Services       Precautions / Restrictions Precautions Required Braces or Orthoses: Knee Immobilizer - Right Knee Immobilizer - Right: Discontinue once straight leg raise with < 10 degree lag    Mobility  Bed Mobility Overal bed mobility: Modified Independent Bed Mobility: Supine to Sit;Sit to Supine              Transfers   Equipment used: Rolling walker (2 wheeled) Transfers: Sit to/from Stand Sit to Stand: Supervision         General transfer comment: cues for hand placement  Ambulation/Gait Ambulation/Gait assistance: Supervision Gait Distance (Feet): 200 Feet Assistive device: Rolling walker (2 wheeled) Gait Pattern/deviations: Step-to pattern;Step-through pattern     General Gait Details: cues for sequence   Stairs             Wheelchair Mobility    Modified Rankin (Stroke Patients Only)       Balance                                            Cognition Arousal/Alertness: Awake/alert                                            Exercises Total Joint Exercises Ankle Circles/Pumps: AROM;Both;10 reps;Supine Quad Sets: AROM;Both;10 reps;Supine Heel Slides: AAROM;Right;10 reps;Supine Hip ABduction/ADduction: AAROM;Right;10 reps;Supine Straight Leg Raises: AAROM;Right;10 reps;Supine Goniometric ROM: 10-50 right knee flexion    General Comments         Pertinent Vitals/Pain Pain Score: 5  Pain Location: right knee, thigh Pain Descriptors / Indicators: Sore;Discomfort;Grimacing Pain Intervention(s): Repositioned;Ice applied;Premedicated before session;Monitored during session    Home Living                      Prior Function            PT Goals (current goals can now be found in the care plan section) Progress towards PT goals: Progressing toward goals    Frequency    7X/week      PT Plan Current plan remains appropriate    Co-evaluation              AM-PAC PT "6 Clicks" Daily Activity  Outcome Measure  Difficulty turning over in bed (including adjusting bedclothes, sheets and blankets)?: None Difficulty moving from lying on back to sitting on the side of the bed? : None Difficulty sitting down on and standing up from a chair with arms (e.g., wheelchair, bedside commode, etc,.)?: A Little Help needed moving to and from a bed to chair (including a wheelchair)?: A Little Help needed walking in hospital room?: A Little Help needed climbing 3-5 steps with a railing? : A Lot 6 Click  Score: 19    End of Session Equipment Utilized During Treatment: Right knee immobilizer Activity Tolerance: Patient tolerated treatment well Patient left: in bed;in CPM   PT Visit Diagnosis: Unsteadiness on feet (R26.81);Pain Pain - Right/Left: Right Pain - part of body: Knee     Time: 1610-9604 PT Time Calculation (min) (ACUTE ONLY): 27 min  Charges:  $Gait Training: 8-22 mins $Therapeutic Exercise: 8-22 mins                     [name] PT Acute Rehabilitation Services Pager [phone] Office [phone]    [name] 08/12/2018, 2:43 PM

## 2018-08-12 NOTE — Progress Notes (Signed)
   Subjective: 1 Day Post-Op Procedure(s) (LRB): RIGHT TOTAL KNEE ARTHROPLASTY (Right) Patient reports pain as mild.   Patient seen in rounds by Dr. Lequita Halt. Patient is well, and has had no acute complaints or problems. Did well ambulating with PT yesterday, will continue working with therapy today. States he is ready to go home. No issues overnight. Foley catheter removed this AM. Denies chest pain, SOB or calf pain.  We will start therapy today.  Objective: Vital signs in last 24 hours: Temp:  [97.5 F (36.4 C)-98.2 F (36.8 C)] 97.9 F (36.6 C) (09/10 0447) Pulse Rate:  [57-111] 62 (09/10 0447) Resp:  [11-24] 17 (09/10 0447) BP: (97-146)/(58-99) 139/87 (09/10 0447) SpO2:  [93 %-100 %] 97 % (09/10 0447)  Intake/Output from previous day:  Intake/Output Summary (Last 24 hours) at 08/12/2018 0753 Last data filed at 08/12/2018 0446 Gross per 24 hour  Intake 3600.42 ml  Output 1910 ml  Net 1690.42 ml     Labs: Recent Labs    08/12/18 0440  HGB 13.4   Recent Labs    08/12/18 0440  WBC 15.3*  RBC 4.47  HCT 40.2  PLT 300   Recent Labs    08/12/18 0440  NA 139  K 3.7  CL 105  CO2 23  BUN 14  CREATININE 1.30*  GLUCOSE 143*  CALCIUM 8.6*   Exam: General - Patient is Alert and Oriented Extremity - Neurologically intact Neurovascular intact Sensation intact distally Dorsiflexion/Plantar flexion intact Dressing - dressing C/D/I Motor Function - intact, moving foot and toes well on exam.   Past Medical History:  Diagnosis Date  . Anxiety    PTSD  . Arthritis   . Chronic kidney disease   . Depression   . GERD (gastroesophageal reflux disease)   . Sleep apnea     Assessment/Plan: 1 Day Post-Op Procedure(s) (LRB): RIGHT TOTAL KNEE ARTHROPLASTY (Right) Active Problems:   OA (osteoarthritis) of knee  Estimated body mass index is 31.66 kg/m as calculated from the following:   Height as of this encounter: 6\' 4"  (1.93 m).   Weight as of this encounter:  118 kg. Advance diet Up with therapy D/C IV fluids  Anticipated LOS equal to or greater than 2 midnights due to - Age 48 and older with one or more of the following:  - Obesity  - Expected need for hospital services (PT, OT, Nursing) required for safe  discharge  - Anticipated need for postoperative skilled nursing care or inpatient rehab  - Active co-morbidities: Chronic kidney disease OR   - Unanticipated findings during/Post Surgery: None  - Patient is a high risk of re-admission due to: None    DVT Prophylaxis - Aspirin Weight bearing as tolerated. D/C O2 and pulse ox and try on room air. Hemovac pulled without difficulty, will continue therapy today.  Plan is to go Home after hospital stay. Plan for discharge today as long as patient progresses with therapy and is meeting his goals. Scheduled for outpatient PT at [facility]. Follow-up in the office in 2 weeks with Dr. Lequita Halt.   Arther Abbott, PA-C Orthopedic Surgery 08/12/2018, 7:53 AM

## 2018-08-12 NOTE — Progress Notes (Signed)
Physical Therapy Treatment Patient Details Name: Nicholas Nash MRN: [id number] DOB: 12-Sep-1971 Today's Date: 08/12/2018    History of Present Illness Right TKA, H/O LTKA 10/12, and 2 back surgeries    PT Comments    Patient reports thigh pain is better with heat. Ready for DC.   Follow Up Recommendations  Follow surgeon's recommendation for DC plan and follow-up therapies;Outpatient PT     Equipment Recommendations  None recommended by PT    Recommendations for Other Services       Precautions / Restrictions Precautions Required Braces or Orthoses: Knee Immobilizer - Right Knee Immobilizer - Right: Discontinue once straight leg raise with < 10 degree lag    Mobility  Bed Mobility Overal bed mobility: Modified Independent Bed Mobility: Supine to Sit;Sit to Supine              Transfers   Equipment used: Rolling walker (2 wheeled) Transfers: Sit to/from Stand Sit to Stand: Supervision         General transfer comment: cues for hand placement  Ambulation/Gait Ambulation/Gait assistance: Supervision Gait Distance (Feet): 400 Feet Assistive device: Rolling walker (2 wheeled) Gait Pattern/deviations: Step-through pattern     General Gait Details: cues for sequence   Stairs Stairs: Yes Stairs assistance: Min guard Stair Management: Step to pattern;Forwards;Two rails Number of Stairs: 5 General stair comments: cues for sequence   Wheelchair Mobility    Modified Rankin (Stroke Patients Only)       Balance                                            Cognition Arousal/Alertness: Awake/alert                                            Exercises   General Comments        Pertinent Vitals/Pain Pain Score: 3  Pain Location: right knee, thigh Pain Descriptors / Indicators: Sore;Discomfort;Grimacing Pain Intervention(s): Limited activity within patient's tolerance;Ice applied;Heat applied;Premedicated  before session;Repositioned;Monitored during session    Home Living                      Prior Function            PT Goals (current goals can now be found in the care plan section) Progress towards PT goals: Progressing toward goals    Frequency    7X/week      PT Plan Current plan remains appropriate    Co-evaluation              AM-PAC PT "6 Clicks" Daily Activity  Outcome Measure  Difficulty turning over in bed (including adjusting bedclothes, sheets and blankets)?: None Difficulty moving from lying on back to sitting on the side of the bed? : None Difficulty sitting down on and standing up from a chair with arms (e.g., wheelchair, bedside commode, etc,.)?: A Little Help needed moving to and from a bed to chair (including a wheelchair)?: A Little Help needed walking in hospital room?: A Little Help needed climbing 3-5 steps with a railing? : A Little 6 Click Score: 20    End of Session Equipment Utilized During Treatment: Right knee immobilizer Activity Tolerance: Patient tolerated treatment well Patient left: in bed Nurse  Communication: Mobility status PT Visit Diagnosis: Unsteadiness on feet (R26.81);Pain Pain - Right/Left: Right Pain - part of body: Knee     Time: 1336-1400 PT Time Calculation (min) (ACUTE ONLY): 24 min  Charges:  $Gait Training: 23-37 mins $     [name] PT Acute Rehabilitation Services Pager [phone] Office [phone]    [name] 08/12/2018, 2:47 PM

## 2018-08-13 NOTE — Discharge Summary (Signed)
Physician Discharge Summary   Patient ID: Nicholas Nash MRN: [id number] DOB/AGE: 1971-09-13 47 y.o.  Admit date: 08/11/2018 Discharge date: 08/12/2018  Primary Diagnosis: Osteoarthritis, right knee   Admission Diagnoses:  Past Medical History:  Diagnosis Date  . Anxiety    PTSD  . Arthritis   . Chronic kidney disease   . Depression   . GERD (gastroesophageal reflux disease)   . Sleep apnea    Discharge Diagnoses:   Active Problems:   OA (osteoarthritis) of knee  Estimated body mass index is 31.66 kg/m as calculated from the following:   Height as of this encounter: 6' 4"  (1.93 m).   Weight as of this encounter: 118 kg.  Procedure:  Procedure(s) (LRB): RIGHT TOTAL KNEE ARTHROPLASTY (Right)   Consults: None  HPI: Nicholas Nash is a 47 y.o. year old male with end stage OA of his right knee with progressively worsening pain and dysfunction. He has constant pain, with activity and at rest and significant functional deficits with difficulties even with ADLs. He has had extensive non-op management including analgesics, injections of cortisone, and home exercise program, but remains in significant pain with significant dysfunction. Radiographs show bone on bone arthritis medial and patellofemoral. He presents now for right Total Knee Arthroplasty.  Laboratory Data: Admission on 08/11/2018, Discharged on 08/12/2018  Component Date Value Ref Range Status  . WBC 08/12/2018 15.3* 4.0 - 10.5 K/uL Final  . RBC 08/12/2018 4.47  4.22 - 5.81 MIL/uL Final  . Hemoglobin 08/12/2018 13.4  13.0 - 17.0 g/dL Final  . HCT 08/12/2018 40.2  39.0 - 52.0 % Final  . MCV 08/12/2018 89.9  78.0 - 100.0 fL Final  . MCH 08/12/2018 30.0  26.0 - 34.0 pg Final  . MCHC 08/12/2018 33.3  30.0 - 36.0 g/dL Final  . RDW 08/12/2018 12.9  11.5 - 15.5 % Final  . Platelets 08/12/2018 300  150 - 400 K/uL Final   Performed at [facility], [place] [address] [place], [place] [address]  . Sodium  08/12/2018 139  135 - 145 mmol/L Final  . Potassium 08/12/2018 3.7  3.5 - 5.1 mmol/L Final  . Chloride 08/12/2018 105  98 - 111 mmol/L Final  . CO2 08/12/2018 23  22 - 32 mmol/L Final  . Glucose, Bld 08/12/2018 143* 70 - 99 mg/dL Final  . BUN 08/12/2018 14  6 - 20 mg/dL Final  . Creatinine, Ser 08/12/2018 1.30* 0.61 - 1.24 mg/dL Final  . Calcium 08/12/2018 8.6* 8.9 - 10.3 mg/dL Final  . GFR calc non Af Amer 08/12/2018 >60  >60 mL/min Final  . GFR calc Af Amer 08/12/2018 >60  >60 mL/min Final   Comment: (NOTE) The eGFR has been calculated using the CKD EPI equation. This calculation has not been validated in all clinical situations. eGFR's persistently <60 mL/min signify possible Chronic Kidney Disease.   Georgiann Hahn gap 08/12/2018 11  5 - 15 Final   Performed at [facility], [place] [address] [place], [place] [address]  Hospital Outpatient Visit on 08/05/2018  Component Date Value Ref Range Status  . aPTT 08/05/2018 26  24 - 36 seconds Final   Performed at [facility], [place] [address], [place],  [address]  . WBC 08/05/2018 6.9  4.0 - 10.5 K/uL Final  . RBC 08/05/2018 5.18  4.22 - 5.81 MIL/uL Final  . Hemoglobin 08/05/2018 15.6  13.0 - 17.0 g/dL Final  . HCT 08/05/2018 46.6  39.0 -  52.0 % Final  . MCV 08/05/2018 90.0  78.0 - 100.0 fL Final  . MCH 08/05/2018 30.1  26.0 - 34.0 pg Final  . MCHC 08/05/2018 33.5  30.0 - 36.0 g/dL Final  . RDW 08/05/2018 13.0  11.5 - 15.5 % Final  . Platelets 08/05/2018 303  150 - 400 K/uL Final   Performed at [facility], [place] [address], [place], [place] [address]  . Sodium 08/05/2018 140  135 - 145 mmol/L Final  . Potassium 08/05/2018 4.0  3.5 - 5.1 mmol/L Final  . Chloride 08/05/2018 103  98 - 111 mmol/L Final  . CO2 08/05/2018 27  22 - 32 mmol/L Final  . Glucose, Bld 08/05/2018 86  70 - 99 mg/dL Final  . BUN 08/05/2018 17  6 - 20 mg/dL Final  . Creatinine, Ser 08/05/2018 1.26* 0.61 - 1.24  mg/dL Final  . Calcium 08/05/2018 9.3  8.9 - 10.3 mg/dL Final  . Total Protein 08/05/2018 6.9  6.5 - 8.1 g/dL Final  . Albumin 08/05/2018 4.3  3.5 - 5.0 g/dL Final  . AST 08/05/2018 32  15 - 41 U/L Final  . ALT 08/05/2018 30  0 - 44 U/L Final  . Alkaline Phosphatase 08/05/2018 57  38 - 126 U/L Final  . Total Bilirubin 08/05/2018 0.6  0.3 - 1.2 mg/dL Final  . GFR calc non Af Amer 08/05/2018 >60  >60 mL/min Final  . GFR calc Af Amer 08/05/2018 >60  >60 mL/min Final   Comment: (NOTE) The eGFR has been calculated using the CKD EPI equation. This calculation has not been validated in all clinical situations. eGFR's persistently <60 mL/min signify possible Chronic Kidney Disease.   Georgiann Hahn gap 08/05/2018 10  5 - 15 Final   Performed at [facility], [place] [address] [place], [place] [address]  . Prothrombin Time 08/05/2018 12.8  11.4 - 15.2 seconds Final  . INR 08/05/2018 0.97   Final   Performed at [facility], [place] [address], [place], [place] [address]  . ABO/RH(D) 08/05/2018 A NEG   Final  . Antibody Screen 08/05/2018 NEG   Final  . Sample Expiration 08/05/2018 08/14/2018   Final  . Extend sample reason 08/05/2018    Final                   Value:NO TRANSFUSIONS OR PREGNANCY IN THE PAST 3 MONTHS Performed at [facility], [place] [address] [place], [place] [address]   . MRSA, PCR 08/05/2018 NEGATIVE  NEGATIVE Final  . Staphylococcus aureus 08/05/2018 POSITIVE* NEGATIVE Final   Comment: (NOTE) The Xpert SA Assay (FDA approved for NASAL specimens in patients 66 years of age and older), is one component of a comprehensive surveillance program. It is not intended to diagnose infection nor to guide or monitor treatment. Performed at [facility], [place] [address], [place], [place] [address]      X-Rays:No results found.  EKG:No orders found for this or any previous visit.   Hospital Course: DAWID DUPRIEST is  a 47 y.o. who was admitted to [facility]. They were brought to the operating room on 08/11/2018 and underwent Procedure(s): RIGHT TOTAL KNEE ARTHROPLASTY.  Patient tolerated the procedure well and was later transferred to the recovery room and then to the orthopaedic floor for postoperative care. They were given PO and IV analgesics for pain control following their surgery. They were given 24 hours of postoperative antibiotics of  Anti-infectives (From admission, onward)   Start     Dose/Rate Route Frequency Ordered Stop   08/11/18 1500  ceFAZolin (ANCEF) IVPB 2g/100 mL premix     2 g 200 mL/hr over 30 Minutes Intravenous Every 6 hours 08/11/18 1235 08/11/18 2156   08/11/18 0700  ceFAZolin (ANCEF) IVPB 2g/100 mL premix     2 g 200 mL/hr over 30 Minutes Intravenous On call to O.R. 08/11/18 0277 08/11/18 0916     and started on DVT prophylaxis in the form of Aspirin.   PT and OT were ordered for total joint protocol. Discharge planning consulted to help with postop disposition and equipment needs.  Patient had a good night on the evening of surgery. They started to get up OOB with therapy on POD #0. Pt was seen during rounds and was ready to go home pending progress with therapy. Hemovac drain was pulled without difficulty. He worked with therapy on POD #1 and was meeting his goals. Pt was discharged to home later that day in stable condition.  Diet: Regular diet Activity: WBAT Follow-up: in 2 weeks with Dr. Wynelle Link Disposition: Home with outpatient PT at [facility] Discharged Condition: stable   Discharge Instructions    Call MD / Call 911   Complete by:  As directed    If you experience chest pain or shortness of breath, CALL 911 and be transported to the hospital emergency room.  If you develope a fever above 101 F, pus (white drainage) or increased drainage or redness at the wound, or calf pain, call your surgeon's office.   Change dressing   Complete by:  As directed    Change  dressing on Wednesday (09/11), then change the dressing daily with sterile 4 x 4 inch gauze dressing and apply TED hose.   Constipation Prevention   Complete by:  As directed    Drink plenty of fluids.  Prune juice may be helpful.  You may use a stool softener, such as Colace (over the counter) 100 mg twice a day.  Use MiraLax (over the counter) for constipation as needed.   Diet - low sodium heart healthy   Complete by:  As directed    Discharge instructions   Complete by:  As directed    Dr. Gaynelle Arabian Total Joint Specialist [facility] [address] [place], [place] [address] [phone]  TOTAL KNEE REPLACEMENT POSTOPERATIVE DIRECTIONS  Knee Rehabilitation, Guidelines Following Surgery  Results after knee surgery are often greatly improved when you follow the exercise, range of motion and muscle strengthening exercises prescribed by your doctor. Safety measures are also important to protect the knee from further injury. Any time any of these exercises cause you to have increased pain or swelling in your knee joint, decrease the amount until you are comfortable again and slowly increase them. If you have problems or questions, call your caregiver or physical therapist for advice.   HOME CARE INSTRUCTIONS  Remove items at home which could result in a fall. This includes throw rugs or furniture in walking pathways.  ICE to the affected knee every three hours for 30 minutes at a time and then as needed for pain and swelling.  Continue to use ice on the knee for pain and swelling from surgery. You may notice swelling that will progress down to the foot and ankle.  This is normal after surgery.  Elevate the leg when you are not up walking on it.   Continue to use the breathing  machine which will help keep your temperature down.  It is common for your temperature to cycle up and down following surgery, especially at night when you are not up moving around and exerting yourself.   The breathing machine keeps your lungs expanded and your temperature down. Do not place pillow under knee, focus on keeping the knee straight while resting   DIET You may resume your previous home diet once your are discharged from the hospital.  DRESSING / WOUND CARE / SHOWERING You may shower 3 days after surgery, but keep the wounds dry during showering.  You may use an occlusive plastic wrap (Press'n Seal for example), NO SOAKING/SUBMERGING IN THE BATHTUB.  If the bandage gets wet, change with a clean dry gauze.  If the incision gets wet, pat the wound dry with a clean towel. You may start showering once you are discharged home but do not submerge the incision under water. Just pat the incision dry and apply a dry gauze dressing on daily. Change the surgical dressing daily and reapply a dry dressing each time.  ACTIVITY Walk with your walker as instructed. Use walker as long as suggested by your caregivers. Avoid periods of inactivity such as sitting longer than an hour when not asleep. This helps prevent blood clots.  You may resume a sexual relationship in one month or when given the OK by your doctor.  You may return to work once you are cleared by your doctor.  Do not drive a car for 6 weeks or until released by you surgeon.  Do not drive while taking narcotics.  WEIGHT BEARING Weight bearing as tolerated with assist device (walker, cane, etc) as directed, use it as long as suggested by your surgeon or therapist, typically at least 4-6 weeks.  POSTOPERATIVE CONSTIPATION PROTOCOL Constipation - defined medically as fewer than three stools per week and severe constipation as less than one stool per week.  One of the most common issues patients have following surgery is constipation.  Even if you have a regular bowel pattern at home, your normal regimen is likely to be disrupted due to multiple reasons following surgery.  Combination of anesthesia, postoperative narcotics, change in  appetite and fluid intake all can affect your bowels.  In order to avoid complications following surgery, here are some recommendations in order to help you during your recovery period.  Colace (docusate) - Pick up an over-the-counter form of Colace or another stool softener and take twice a day as long as you are requiring postoperative pain medications.  Take with a full glass of water daily.  If you experience loose stools or diarrhea, hold the colace until you stool forms back up.  If your symptoms do not get better within 1 week or if they get worse, check with your doctor.  Dulcolax (bisacodyl) - Pick up over-the-counter and take as directed by the product packaging as needed to assist with the movement of your bowels.  Take with a full glass of water.  Use this product as needed if not relieved by Colace only.   MiraLax (polyethylene glycol) - Pick up over-the-counter to have on hand.  MiraLax is a solution that will increase the amount of water in your bowels to assist with bowel movements.  Take as directed and can mix with a glass of water, juice, soda, coffee, or tea.  Take if you go more than two days without a movement. Do not use MiraLax more than once per day. Call your  doctor if you are still constipated or irregular after using this medication for 7 days in a row.  If you continue to have problems with postoperative constipation, please contact the office for further assistance and recommendations.  If you experience "the worst abdominal pain ever" or develop nausea or vomiting, please contact the office immediatly for further recommendations for treatment.  ITCHING  If you experience itching with your medications, try taking only a single pain pill, or even half a pain pill at a time.  You can also use Benadryl over the counter for itching or also to help with sleep.   TED HOSE STOCKINGS Wear the elastic stockings on both legs for three weeks following surgery during the day but you  may remove then at night for sleeping.  MEDICATIONS See your medication summary on the "After Visit Summary" that the nursing staff will review with you prior to discharge.  You may have some home medications which will be placed on hold until you complete the course of blood thinner medication.  It is important for you to complete the blood thinner medication as prescribed by your surgeon.  Continue your approved medications as instructed at time of discharge.  PRECAUTIONS If you experience chest pain or shortness of breath - call 911 immediately for transfer to the hospital emergency department.  If you develop a fever greater that 101 F, purulent drainage from wound, increased redness or drainage from wound, foul odor from the wound/dressing, or calf pain - CONTACT YOUR SURGEON.                                                   FOLLOW-UP APPOINTMENTS Make sure you keep all of your appointments after your operation with your surgeon and caregivers. You should call the office at the above phone number and make an appointment for approximately two weeks after the date of your surgery or on the date instructed by your surgeon outlined in the "After Visit Summary".   RANGE OF MOTION AND STRENGTHENING EXERCISES  Rehabilitation of the knee is important following a knee injury or an operation. After just a few days of immobilization, the muscles of the thigh which control the knee become weakened and shrink (atrophy). Knee exercises are designed to build up the tone and strength of the thigh muscles and to improve knee motion. Often times heat used for twenty to thirty minutes before working out will loosen up your tissues and help with improving the range of motion but do not use heat for the first two weeks following surgery. These exercises can be done on a training (exercise) mat, on the floor, on a table or on a bed. Use what ever works the best and is most comfortable for you Knee exercises include:    Leg Lifts - While your knee is still immobilized in a splint or cast, you can do straight leg raises. Lift the leg to 60 degrees, hold for 3 sec, and slowly lower the leg. Repeat 10-20 times 2-3 times daily. Perform this exercise against resistance later as your knee gets better.  Quad and Hamstring Sets - Tighten up the muscle on the front of the thigh (Quad) and hold for 5-10 sec. Repeat this 10-20 times hourly. Hamstring sets are done by pushing the foot backward against an object and holding for 5-10 sec.  Repeat as with quad sets.  Leg Slides: Lying on your back, slowly slide your foot toward your buttocks, bending your knee up off the floor (only go as far as is comfortable). Then slowly slide your foot back down until your leg is flat on the floor again. Angel Wings: Lying on your back spread your legs to the side as far apart as you can without causing discomfort.  A rehabilitation program following serious knee injuries can speed recovery and prevent re-injury in the future due to weakened muscles. Contact your doctor or a physical therapist for more information on knee rehabilitation.   IF YOU ARE TRANSFERRED TO A SKILLED REHAB FACILITY If the patient is transferred to a skilled rehab facility following release from the hospital, a list of the current medications will be sent to the facility for the patient to continue.  When discharged from the skilled rehab facility, please have the facility set up the patient's [place] prior to being released. Also, the skilled facility will be responsible for providing the patient with their medications at time of release from the facility to include their pain medication, the muscle relaxants, and their blood thinner medication. If the patient is still at the rehab facility at time of the two week follow up appointment, the skilled rehab facility will also need to assist the patient in arranging follow up appointment in our office and any  transportation needs.  MAKE SURE YOU:  Understand these instructions.  Get help right away if you are not doing well or get worse.    Pick up stool softner and laxative for home use following surgery while on pain medications. Do not submerge incision under water. Please use good hand washing techniques while changing dressing each day. May shower starting three days after surgery. Please use a clean towel to pat the incision dry following showers. Continue to use ice for pain and swelling after surgery. Do not use any lotions or creams on the incision until instructed by your surgeon.   Do not put a pillow under the knee. Place it under the heel.   Complete by:  As directed    Driving restrictions   Complete by:  As directed    No driving for two weeks   TED hose   Complete by:  As directed    Use stockings (TED hose) for three weeks on both leg(s).  You may remove them at night for sleeping.   Weight bearing as tolerated   Complete by:  As directed      Allergies as of 08/12/2018   No Known Allergies     Medication List    STOP taking these medications   cyclobenzaprine 10 MG tablet Commonly known as:  FLEXERIL   rivaroxaban 10 MG Tabs tablet Commonly known as:  XARELTO     TAKE these medications   aspirin 325 MG EC tablet Take 1 tablet (325 mg total) by mouth 2 (two) times daily for 20 days. Take one tablet (325 mg) Aspirin two times a day for three weeks following surgery. Then take one baby Aspirin (81 mg) once a day for three weeks. Then discontinue aspirin.   buPROPion 100 MG tablet Commonly known as:  WELLBUTRIN Take 100 mg by mouth daily.   gabapentin 300 MG capsule Commonly known as:  NEURONTIN Take 1 capsule (300 mg total) by mouth 3 (three) times daily. Gabapentin 300 mg Protocol Take a 300 mg capsule three times a day for  two weeks following surgery. Then take a 300 mg capsule two times a day for two weeks.  Then take a 300 mg capsule once a day for  two weeks.  Then discontinue the Gabapentin.   lactobacillus acidophilus Tabs tablet Take 2 tablets by mouth daily.   methocarbamol 500 MG tablet Commonly known as:  ROBAXIN Take 1 tablet (500 mg total) by mouth every 6 (six) hours as needed for muscle spasms.   multivitamin with minerals Tabs tablet Take 1 tablet by mouth daily.   omeprazole 40 MG capsule Commonly known as:  PRILOSEC Take 80 mg by mouth daily.   oxyCODONE 5 MG immediate release tablet Commonly known as:  Oxy IR/ROXICODONE Take 1-2 tablets (5-10 mg total) by mouth every 6 (six) hours as needed for moderate pain (pain score 4-6). What changed:    how much to take  when to take this  reasons to take this   venlafaxine XR 75 MG 24 hr capsule Commonly known as:  EFFEXOR-XR Take 225 mg by mouth daily with breakfast.   Vitamin D-3 1000 units Caps Take 1,000 Units by mouth daily.            Discharge Care Instructions  (From admission, onward)         Start     Ordered   08/12/18 0000  Weight bearing as tolerated     08/12/18 0757   08/12/18 0000  Change dressing    Comments:  Change dressing on Wednesday (09/11), then change the dressing daily with sterile 4 x 4 inch gauze dressing and apply TED hose.   08/12/18 0757         Follow-up Information    Gaynelle Arabian, MD. Schedule an appointment as soon as possible for a visit on 08/26/2018.   Specialty:  Orthopedic Surgery Contact information: [address] [place] [address] [phone]           Signed: Theresa Duty, PA-C Orthopedic Surgery 08/13/2018, 8:11 AM

## 2020-11-28 NOTE — Progress Notes (Addendum)
PCP - Khaishoon Basrai Clearance on chart with note [facility]  Cardiologist - no  PPM/ICD -  Device Orders -  Rep Notified -   Chest x-ray -  EKG -  Stress Test -  ECHO -  Cardiac Cath -   Sleep Study -  CPAP - yes wears sometimes lost 45 LBS  Fasting Blood Sugar -  Checks Blood Sugar _____ times a day  Blood Thinner Instructions: Aspirin Instructions:  ERAS Protcol - PRE-SURGERY Ensure    COVID TEST- 12-05-20  Activity--Able to walk a flight os stairs without sob limited to hip pain Anesthesia review: PTSD, OSA  Patient denies shortness of breath, fever, cough and chest pain at PAT appointment  NONE   All instructions explained to the patient, with a verbal understanding of the material. Patient agrees to go over the instructions while at home for a better understanding. Patient also instructed to self quarantine after being tested for COVID-19. The opportunity to ask questions was provided.   

## 2020-11-28 NOTE — H&P (View-Only) (Signed)
PCP - Rodena Goldmann Clearance on chart with note [facility]  Cardiologist - no  PPM/ICD -  Device Orders -  Rep Notified -   Chest x-ray -  EKG -  Stress Test -  ECHO -  Cardiac Cath -   Sleep Study -  CPAP - yes wears sometimes lost 45 LBS  Fasting Blood Sugar -  Checks Blood Sugar _____ times a day  Blood Thinner Instructions: Aspirin Instructions:  ERAS Protcol - PRE-SURGERY Ensure    COVID TEST- 12-05-20  Activity--Able to walk a flight os stairs without sob limited to hip pain Anesthesia review: PTSD, OSA  Patient denies shortness of breath, fever, cough and chest pain at PAT appointment  NONE   All instructions explained to the patient, with a verbal understanding of the material. Patient agrees to go over the instructions while at home for a better understanding. Patient also instructed to self quarantine after being tested for COVID-19. The opportunity to ask questions was provided.

## 2020-11-28 NOTE — Patient Instructions (Signed)
DUE TO COVID-19 ONLY ONE VISITOR IS ALLOWED TO COME WITH YOU AND STAY IN THE WAITING ROOM ONLY DURING PRE OP AND PROCEDURE DAY OF SURGERY. THE 1 VISITOR  MAY VISIT WITH YOU AFTER SURGERY IN YOUR PRIVATE ROOM DURING VISITING HOURS ONLY!  YOU NEED TO HAVE A COVID 19 TEST ON__1-3-22_____ @_______ , THIS TEST MUST BE DONE BEFORE SURGERY,  COVID TESTING SITE [address] [place] , IT IS ON THE RIGHT GOING OUT [address] APPROXIMATELY  2 MINUTES PAST [employer] ON THE RIGHT. ONCE YOUR COVID TEST IS COMPLETED,  PLEASE BEGIN THE QUARANTINE INSTRUCTIONS AS OUTLINED IN YOUR HANDOUT.                WERNER LABELLA  11/28/2020   Your procedure is scheduled on:  12-07-20   Report to [facility] Main  Entrance   Report to admitting at      1:35 PM     Call this number if you have problems the morning of surgery [phone]    Remember: \NO SOLID FOOD AFTER MIDNIGHT THE NIGHT PRIOR TO SURGERY. NOTHING BY MOUTH EXCEPT CLEAR LIQUIDS UNTIL  1:00 pm . PLEASE FINISH ENSURE DRINK PER SURGEON ORDER  WHICH NEEDS TO BE COMPLETED AT    1:00 PM then nothing by mouth .    CLEAR LIQUID DIET  Water                                                               Black Coffee and tea, regular and decaf                              Plain Jell-O any favor except red or purple                                           Fruit ices (not with fruit pulp)                                    Iced Popsicles                                     Carbonated beverages, regular and diet                                    Cranberry, grape and apple juices Sports drinks like Gatorade Lightly seasoned clear broth or consume(fat free) Sugar, honey syrup   _____________________________________________________________________    BRUSH YOUR TEETH MORNING OF SURGERY AND RINSE YOUR MOUTH OUT, NO CHEWING GUM CANDY OR MINTS.     Take these medicines the morning of surgery with A SIP OF  WATER: venlafaxine, omeprazole, wellbutrin                                 You may not  have any metal on your body including hair pins and              piercings  Do not wear jewelry,  lotions, powders or perfumes, deodorant                        Men may shave face and neck.   Do not bring valuables to the hospital. [facility] IS NOT             RESPONSIBLE   FOR VALUABLES.  Contacts, dentures or bridgework may not be worn into surgery.      Patients discharged the day of surgery will not be allowed to drive home. IF YOU ARE HAVING SURGERY AND GOING HOME THE SAME DAY, YOU MUST HAVE AN ADULT TO DRIVE YOU HOME AND BE WITH YOU FOR 24 HOURS. YOU MAY GO HOME BY TAXI OR UBER OR ORTHERWISE, BUT AN ADULT MUST ACCOMPANY YOU HOME AND STAY WITH YOU FOR 24 HOURS.  Name and phone number of your driver:  Special Instructions: N/A              Please read over the following fact sheets you were given: _____________________________________________________________________             [facility] - Preparing for Surgery Before surgery, you can play an important role.  Because skin is not sterile, your skin needs to be as free of germs as possible.  You can reduce the number of germs on your skin by washing with CHG (chlorahexidine gluconate) soap before surgery.  CHG is an antiseptic cleaner which kills germs and bonds with the skin to continue killing germs even after washing. Please DO NOT use if you have an allergy to CHG or antibacterial soaps.  If your skin becomes reddened/irritated stop using the CHG and inform your nurse when you arrive at Short Stay. Do not shave (including legs and underarms) for at least 48 hours prior to the first CHG shower.  You may shave your face/neck. Please follow these instructions carefully:  1.  Shower with CHG Soap the night before surgery and the  morning of Surgery.  2.  If you choose to wash your hair, wash your hair first as usual with your  normal   shampoo.  3.  After you shampoo, rinse your hair and body thoroughly to remove the  shampoo.                           4.  Use CHG as you would any other liquid soap.  You can apply chg directly  to the skin and wash                       Gently with a scrungie or clean washcloth.  5.  Apply the CHG Soap to your body ONLY FROM THE NECK DOWN.   Do not use on face/ open                           Wound or open sores. Avoid contact with eyes, ears mouth and genitals (private parts).                       Wash face,  Genitals (private parts) with your normal soap.             6.  Wash thoroughly, paying special attention to the area where your surgery  will be performed.  7.  Thoroughly rinse your body with warm water from the neck down.  8.  DO NOT shower/wash with your normal soap after using and rinsing off  the CHG Soap.                9.  Pat yourself dry with a clean towel.            10.  Wear clean pajamas.            11.  Place clean sheets on your bed the night of your first shower and do not  sleep with pets. Day of Surgery : Do not apply any lotions/deodorants the morning of surgery.  Please wear clean clothes to the hospital/surgery center.  FAILURE TO FOLLOW THESE INSTRUCTIONS MAY RESULT IN THE CANCELLATION OF YOUR SURGERY PATIENT SIGNATURE_________________________________  NURSE SIGNATURE__________________________________  ________________________________________________________________________   Rogelia Mire  An incentive spirometer is a tool that can help keep your lungs clear and active. This tool measures how well you are filling your lungs with each breath. Taking long deep breaths may help reverse or decrease the chance of developing breathing (pulmonary) problems (especially infection) following:  A long period of time when you are unable to move or be active. BEFORE THE PROCEDURE   If the spirometer includes an indicator to show your best effort, your nurse or  respiratory therapist will set it to a desired goal.  If possible, sit up straight or lean slightly forward. Try not to slouch.  Hold the incentive spirometer in an upright position. INSTRUCTIONS FOR USE  1. Sit on the edge of your bed if possible, or sit up as far as you can in bed or on a chair. 2. Hold the incentive spirometer in an upright position. 3. Breathe out normally. 4. Place the mouthpiece in your mouth and seal your lips tightly around it. 5. Breathe in slowly and as deeply as possible, raising the piston or the ball toward the top of the column. 6. Hold your breath for 3-5 seconds or for as long as possible. Allow the piston or ball to fall to the bottom of the column. 7. Remove the mouthpiece from your mouth and breathe out normally. 8. Rest for a few seconds and repeat Steps 1 through 7 at least 10 times every 1-2 hours when you are awake. Take your time and take a few normal breaths between deep breaths. 9. The spirometer may include an indicator to show your best effort. Use the indicator as a goal to work toward during each repetition. 10. After each set of 10 deep breaths, practice coughing to be sure your lungs are clear. If you have an incision (the cut made at the time of surgery), support your incision when coughing by placing a pillow or rolled up towels firmly against it. Once you are able to get out of bed, walk around indoors and cough well. You may stop using the incentive spirometer when instructed by your caregiver.  RISKS AND COMPLICATIONS  Take your time so you do not get dizzy or light-headed.  If you are in pain, you may need to take or ask for pain medication before doing incentive spirometry. It is harder to take a deep breath if you are having pain. AFTER USE  Rest and breathe slowly and easily.  It can be helpful to keep track of a log of your progress. Your  caregiver can provide you with a simple table to help with this. If you are using the  spirometer at home, follow these instructions: [place] IF:   You are having difficultly using the spirometer.  You have trouble using the spirometer as often as instructed.  Your pain medication is not giving enough relief while using the spirometer.  You develop fever of 100.5 F (38.1 C) or higher. SEEK IMMEDIATE MEDICAL CARE IF:   You cough up bloody sputum that had not been present before.  You develop fever of 102 F (38.9 C) or greater.  You develop worsening pain at or near the incision site. MAKE SURE YOU:   Understand these instructions.  Will watch your condition.  Will get help right away if you are not doing well or get worse. Document Released: 04/01/2007 Document Revised: 02/11/2012 Document Reviewed: 06/02/2007 [facility] Patient Information 2014 [place], [place].   ________________________________________________________________________

## 2020-11-29 ENCOUNTER — Other Ambulatory Visit: Payer: Self-pay

## 2020-11-29 ENCOUNTER — Encounter (HOSPITAL_COMMUNITY)
Admission: RE | Admit: 2020-11-29 | Discharge: 2020-11-29 | Disposition: A | Discharge status: Home / Self Care | Payer: No Typology Code available for payment source | Source: Ambulatory Visit | Attending: Orthopedic Surgery | Admitting: Orthopedic Surgery

## 2020-11-29 ENCOUNTER — Encounter (HOSPITAL_COMMUNITY): Payer: Self-pay

## 2020-11-29 DIAGNOSIS — Z01812 Encounter for preprocedural laboratory examination: Secondary | ICD-10-CM | POA: Insufficient documentation

## 2020-11-29 HISTORY — DX: Myoneural disorder, unspecified: G70.9

## 2020-11-29 HISTORY — DX: Other complications of anesthesia, initial encounter: T88.59XA

## 2020-11-29 HISTORY — DX: Personal history of urinary calculi: Z87.442

## 2020-11-29 LAB — CBC
HCT: 40.7 % (ref 39.0–52.0)
Hemoglobin: 13.9 g/dL (ref 13.0–17.0)
MCH: 31.7 pg (ref 26.0–34.0)
MCHC: 34.2 g/dL (ref 30.0–36.0)
MCV: 92.9 fL (ref 80.0–100.0)
Platelets: 278 10*3/uL (ref 150–400)
RBC: 4.38 MIL/uL (ref 4.22–5.81)
RDW: 12.8 % (ref 11.5–15.5)
WBC: 6 10*3/uL (ref 4.0–10.5)
nRBC: 0 % (ref 0.0–0.2)

## 2020-11-29 LAB — COMPREHENSIVE METABOLIC PANEL
ALT: 21 U/L (ref 0–44)
AST: 23 U/L (ref 15–41)
Albumin: 4.3 g/dL (ref 3.5–5.0)
Alkaline Phosphatase: 64 U/L (ref 38–126)
Anion gap: 8 (ref 5–15)
BUN: 22 mg/dL — ABNORMAL HIGH (ref 6–20)
CO2: 27 mmol/L (ref 22–32)
Calcium: 9.3 mg/dL (ref 8.9–10.3)
Chloride: 107 mmol/L (ref 98–111)
Creatinine, Ser: 1.28 mg/dL — ABNORMAL HIGH (ref 0.61–1.24)
GFR, Estimated: >60 mL/min (ref >60)
Glucose, Bld: 93 mg/dL (ref 70–99)
Potassium: 3.9 mmol/L (ref 3.5–5.1)
Sodium: 142 mmol/L (ref 135–145)
Total Bilirubin: 0.5 mg/dL (ref 0.3–1.2)
Total Protein: 6.8 g/dL (ref 6.5–8.1)

## 2020-11-29 LAB — PROTIME-INR
INR: 1 (ref 0.8–1.2)
Prothrombin Time: 12.6 seconds (ref 11.4–15.2)

## 2020-11-29 LAB — APTT: aPTT: 25 seconds (ref 24–36)

## 2020-11-29 LAB — SURGICAL PCR SCREEN
MRSA, PCR: NEGATIVE
Staphylococcus aureus: POSITIVE — AB

## 2020-12-05 ENCOUNTER — Other Ambulatory Visit (HOSPITAL_COMMUNITY)
Admission: RE | Admit: 2020-12-05 | Discharge: 2020-12-05 | Disposition: A | Discharge status: Home / Self Care | Payer: No Typology Code available for payment source | Source: Ambulatory Visit | Attending: Orthopedic Surgery | Admitting: Orthopedic Surgery

## 2020-12-05 DIAGNOSIS — Z01818 Encounter for other preprocedural examination: Secondary | ICD-10-CM | POA: Diagnosis present

## 2020-12-05 DIAGNOSIS — Z20822 Contact with and (suspected) exposure to covid-19: Secondary | ICD-10-CM | POA: Diagnosis not present

## 2020-12-06 LAB — SARS CORONAVIRUS 2 (TAT 6-24 HRS): SARS Coronavirus 2: NEGATIVE

## 2020-12-07 ENCOUNTER — Observation Stay (HOSPITAL_COMMUNITY): Payer: No Typology Code available for payment source

## 2020-12-07 ENCOUNTER — Encounter (HOSPITAL_COMMUNITY): Payer: Self-pay | Admitting: Orthopedic Surgery

## 2020-12-07 ENCOUNTER — Ambulatory Visit (HOSPITAL_COMMUNITY): Payer: No Typology Code available for payment source | Admitting: Anesthesiology

## 2020-12-07 ENCOUNTER — Encounter (HOSPITAL_COMMUNITY)
Admission: RE | Disposition: A | Discharge status: Home / Self Care | Payer: Self-pay | Source: Home / Self Care | Attending: Orthopedic Surgery

## 2020-12-07 ENCOUNTER — Ambulatory Visit (HOSPITAL_COMMUNITY): Payer: No Typology Code available for payment source

## 2020-12-07 ENCOUNTER — Observation Stay (HOSPITAL_COMMUNITY)
Admission: RE | Admit: 2020-12-07 | Discharge: 2020-12-08 | Disposition: A | Discharge status: Home / Self Care | Payer: No Typology Code available for payment source | Attending: Orthopedic Surgery | Admitting: Orthopedic Surgery

## 2020-12-07 DIAGNOSIS — M5430 Sciatica, unspecified side: Secondary | ICD-10-CM | POA: Diagnosis not present

## 2020-12-07 DIAGNOSIS — Z96649 Presence of unspecified artificial hip joint: Secondary | ICD-10-CM

## 2020-12-07 DIAGNOSIS — M169 Osteoarthritis of hip, unspecified: Secondary | ICD-10-CM | POA: Diagnosis present

## 2020-12-07 DIAGNOSIS — M1611 Unilateral primary osteoarthritis, right hip: Principal | ICD-10-CM | POA: Insufficient documentation

## 2020-12-07 DIAGNOSIS — Z419 Encounter for procedure for purposes other than remedying health state, unspecified: Secondary | ICD-10-CM

## 2020-12-07 HISTORY — PX: TOTAL HIP ARTHROPLASTY: SHX124

## 2020-12-07 LAB — TYPE AND SCREEN
ABO/RH(D): A NEG
Antibody Screen: NEGATIVE

## 2020-12-07 SURGERY — ARTHROPLASTY, HIP, TOTAL, ANTERIOR APPROACH
Anesthesia: Monitor Anesthesia Care | Site: Hip | Laterality: Right

## 2020-12-07 MED ORDER — VENLAFAXINE HCL ER 150 MG PO CP24
150.0000 mg | ORAL_CAPSULE | Freq: Every day | ORAL | Status: DC
  Administered 2020-12-08: 150 mg via ORAL
  Filled 2020-12-07: qty 1

## 2020-12-07 MED ORDER — PHENOL 1.4 % MT LIQD
1.0000 | OROMUCOSAL | Status: DC | PRN
  Filled 2020-12-07: qty 177

## 2020-12-07 MED ORDER — LACTATED RINGERS IV SOLN: INTRAVENOUS | Status: DC

## 2020-12-07 MED ORDER — CEFAZOLIN SODIUM-DEXTROSE 2-4 GM/100ML-% IV SOLN
2.0000 g | Freq: Four times a day (QID) | INTRAVENOUS | Status: AC
  Administered 2020-12-07 – 2020-12-08 (×2): 2 g via INTRAVENOUS
  Filled 2020-12-07 (×2): qty 100

## 2020-12-07 MED ORDER — CHLORHEXIDINE GLUCONATE 0.12 % MT SOLN
15.0000 mL | Freq: Once | OROMUCOSAL | Status: AC
  Administered 2020-12-07: 15 mL via OROMUCOSAL

## 2020-12-07 MED ORDER — MIDAZOLAM HCL 5 MG/5ML IJ SOLN
INTRAMUSCULAR | Status: DC | PRN
  Administered 2020-12-07: 2 mg via INTRAVENOUS

## 2020-12-07 MED ORDER — LACTATED RINGERS IV SOLN: INTRAVENOUS | Status: DC | PRN

## 2020-12-07 MED ORDER — MAGNESIUM CITRATE PO SOLN: 1.0000 | Freq: Once | ORAL | Status: DC | PRN

## 2020-12-07 MED ORDER — BUPIVACAINE IN DEXTROSE 0.75-8.25 % IT SOLN
INTRATHECAL | Status: DC | PRN
  Administered 2020-12-07: 1.6 mL via INTRATHECAL

## 2020-12-07 MED ORDER — HYDROCODONE-ACETAMINOPHEN 5-325 MG PO TABS
1.0000 | ORAL_TABLET | ORAL | Status: DC | PRN
Start: 2020-12-07 — End: 2020-12-08

## 2020-12-07 MED ORDER — FENTANYL CITRATE (PF) 100 MCG/2ML IJ SOLN
INTRAMUSCULAR | Status: DC | PRN
  Administered 2020-12-07: 100 ug via INTRAVENOUS

## 2020-12-07 MED ORDER — ONDANSETRON HCL 4 MG/2ML IJ SOLN
INTRAMUSCULAR | Status: DC | PRN
  Administered 2020-12-07: 4 mg via INTRAVENOUS

## 2020-12-07 MED ORDER — OXYCODONE HCL 5 MG PO TABS: 5.0000 mg | ORAL_TABLET | ORAL | Status: DC | PRN

## 2020-12-07 MED ORDER — METOCLOPRAMIDE HCL 5 MG PO TABS: 5.0000 mg | ORAL_TABLET | Freq: Three times a day (TID) | ORAL | Status: DC | PRN

## 2020-12-07 MED ORDER — DOCUSATE SODIUM 100 MG PO CAPS
100.0000 mg | ORAL_CAPSULE | Freq: Two times a day (BID) | ORAL | Status: DC
  Administered 2020-12-07 – 2020-12-08 (×2): 100 mg via ORAL
  Filled 2020-12-07 (×2): qty 1

## 2020-12-07 MED ORDER — MIDAZOLAM HCL 2 MG/2ML IJ SOLN
INTRAMUSCULAR | Status: AC
  Filled 2020-12-07: qty 2

## 2020-12-07 MED ORDER — PROPOFOL 10 MG/ML IV BOLUS
INTRAVENOUS | Status: DC | PRN
  Administered 2020-12-07: 20 mg via INTRAVENOUS
  Administered 2020-12-07: 30 mg via INTRAVENOUS

## 2020-12-07 MED ORDER — CEFAZOLIN SODIUM-DEXTROSE 2-4 GM/100ML-% IV SOLN
2.0000 g | INTRAVENOUS | Status: AC
  Administered 2020-12-07: 2 g via INTRAVENOUS
  Filled 2020-12-07: qty 100

## 2020-12-07 MED ORDER — LIDOCAINE HCL (PF) 2 % IJ SOLN
INTRAMUSCULAR | Status: AC
  Filled 2020-12-07: qty 5

## 2020-12-07 MED ORDER — ONDANSETRON HCL 4 MG/2ML IJ SOLN: 4.0000 mg | Freq: Four times a day (QID) | INTRAMUSCULAR | Status: DC | PRN

## 2020-12-07 MED ORDER — WATER FOR IRRIGATION, STERILE IR SOLN
Status: DC | PRN
  Administered 2020-12-07: 2000 mL

## 2020-12-07 MED ORDER — ORAL CARE MOUTH RINSE: 15.0000 mL | Freq: Once | OROMUCOSAL | Status: AC

## 2020-12-07 MED ORDER — ACETAMINOPHEN 10 MG/ML IV SOLN
1000.0000 mg | Freq: Four times a day (QID) | INTRAVENOUS | Status: DC
  Administered 2020-12-07: 1000 mg via INTRAVENOUS
  Filled 2020-12-07: qty 100

## 2020-12-07 MED ORDER — ONDANSETRON HCL 4 MG PO TABS: 4.0000 mg | ORAL_TABLET | Freq: Four times a day (QID) | ORAL | Status: DC | PRN

## 2020-12-07 MED ORDER — POVIDONE-IODINE 10 % EX SWAB
2.0000 "application " | Freq: Once | CUTANEOUS | Status: AC
  Administered 2020-12-07: 2 via TOPICAL

## 2020-12-07 MED ORDER — METOCLOPRAMIDE HCL 5 MG/ML IJ SOLN: 5.0000 mg | Freq: Three times a day (TID) | INTRAMUSCULAR | Status: DC | PRN

## 2020-12-07 MED ORDER — ACETAMINOPHEN 325 MG PO TABS: 325.0000 mg | ORAL_TABLET | Freq: Four times a day (QID) | ORAL | Status: DC | PRN

## 2020-12-07 MED ORDER — MENTHOL 3 MG MT LOZG: 1.0000 | LOZENGE | OROMUCOSAL | Status: DC | PRN

## 2020-12-07 MED ORDER — LIDOCAINE HCL (CARDIAC) PF 100 MG/5ML IV SOSY
PREFILLED_SYRINGE | INTRAVENOUS | Status: DC | PRN
  Administered 2020-12-07: 80 mg via INTRAVENOUS

## 2020-12-07 MED ORDER — DEXAMETHASONE SODIUM PHOSPHATE 10 MG/ML IJ SOLN: 8.0000 mg | Freq: Once | INTRAMUSCULAR | Status: DC

## 2020-12-07 MED ORDER — POLYETHYLENE GLYCOL 3350 17 G PO PACK: 17.0000 g | PACK | Freq: Every day | ORAL | Status: DC | PRN

## 2020-12-07 MED ORDER — PANTOPRAZOLE SODIUM 40 MG PO TBEC
40.0000 mg | DELAYED_RELEASE_TABLET | Freq: Every day | ORAL | Status: DC
  Administered 2020-12-08: 10:00:00 40 mg via ORAL
  Filled 2020-12-07: qty 1

## 2020-12-07 MED ORDER — SODIUM CHLORIDE 0.9 % IV SOLN: INTRAVENOUS | Status: DC

## 2020-12-07 MED ORDER — PHENYLEPHRINE HCL-NACL 10-0.9 MG/250ML-% IV SOLN
INTRAVENOUS | Status: DC | PRN
  Administered 2020-12-07: 50 ug/min via INTRAVENOUS

## 2020-12-07 MED ORDER — OXYCODONE HCL 5 MG PO TABS
10.0000 mg | ORAL_TABLET | ORAL | Status: DC | PRN
  Administered 2020-12-08: 10:00:00 15 mg via ORAL
  Administered 2020-12-08: 10 mg via ORAL
  Administered 2020-12-08: 15 mg via ORAL
  Filled 2020-12-07 (×3): qty 3

## 2020-12-07 MED ORDER — 0.9 % SODIUM CHLORIDE (POUR BTL) OPTIME
TOPICAL | Status: DC | PRN
  Administered 2020-12-07: 1000 mL

## 2020-12-07 MED ORDER — PHENYLEPHRINE 40 MCG/ML (10ML) SYRINGE FOR IV PUSH (FOR BLOOD PRESSURE SUPPORT)
PREFILLED_SYRINGE | INTRAVENOUS | Status: DC | PRN
  Administered 2020-12-07: 80 ug via INTRAVENOUS

## 2020-12-07 MED ORDER — HYDROCODONE-ACETAMINOPHEN 7.5-325 MG PO TABS
1.0000 | ORAL_TABLET | ORAL | Status: DC | PRN
Start: 2020-12-07 — End: 2020-12-08
  Administered 2020-12-07: 1 via ORAL
  Filled 2020-12-07: qty 2

## 2020-12-07 MED ORDER — MORPHINE SULFATE (PF) 2 MG/ML IV SOLN
0.5000 mg | INTRAVENOUS | Status: DC | PRN
  Administered 2020-12-07: 1 mg via INTRAVENOUS
  Filled 2020-12-07: qty 1

## 2020-12-07 MED ORDER — FENTANYL CITRATE (PF) 100 MCG/2ML IJ SOLN
INTRAMUSCULAR | Status: AC
  Filled 2020-12-07: qty 2

## 2020-12-07 MED ORDER — GABAPENTIN 300 MG PO CAPS
300.0000 mg | ORAL_CAPSULE | Freq: Every day | ORAL | Status: DC
  Administered 2020-12-07: 300 mg via ORAL
  Filled 2020-12-07: qty 1

## 2020-12-07 MED ORDER — BUPIVACAINE HCL (PF) 0.25 % IJ SOLN
INTRAMUSCULAR | Status: AC
  Filled 2020-12-07: qty 30

## 2020-12-07 MED ORDER — METHOCARBAMOL 500 MG PO TABS
500.0000 mg | ORAL_TABLET | Freq: Four times a day (QID) | ORAL | Status: DC | PRN
  Administered 2020-12-07: 21:00:00 500 mg via ORAL
  Filled 2020-12-07: qty 1

## 2020-12-07 MED ORDER — DEXAMETHASONE SODIUM PHOSPHATE 10 MG/ML IJ SOLN
10.0000 mg | Freq: Once | INTRAMUSCULAR | Status: AC
  Administered 2020-12-08: 10 mg via INTRAVENOUS
  Filled 2020-12-07: qty 1

## 2020-12-07 MED ORDER — METHOCARBAMOL 1000 MG/10ML IJ SOLN
500.0000 mg | Freq: Four times a day (QID) | INTRAVENOUS | Status: DC | PRN
  Filled 2020-12-07 (×2): qty 5

## 2020-12-07 MED ORDER — BUPROPION HCL 100 MG PO TABS
100.0000 mg | ORAL_TABLET | Freq: Every day | ORAL | Status: DC
  Administered 2020-12-08: 100 mg via ORAL
  Filled 2020-12-07: qty 1

## 2020-12-07 MED ORDER — ASPIRIN EC 325 MG PO TBEC
325.0000 mg | DELAYED_RELEASE_TABLET | Freq: Two times a day (BID) | ORAL | Status: DC
  Administered 2020-12-08: 10:00:00 325 mg via ORAL
  Filled 2020-12-07: qty 1

## 2020-12-07 MED ORDER — TRANEXAMIC ACID-NACL 1000-0.7 MG/100ML-% IV SOLN
1000.0000 mg | INTRAVENOUS | Status: AC
  Administered 2020-12-07: 1000 mg via INTRAVENOUS
  Filled 2020-12-07: qty 100

## 2020-12-07 MED ORDER — DEXAMETHASONE SODIUM PHOSPHATE 10 MG/ML IJ SOLN
INTRAMUSCULAR | Status: DC | PRN
  Administered 2020-12-07: 10 mg via INTRAVENOUS

## 2020-12-07 MED ORDER — BUPIVACAINE HCL (PF) 0.25 % IJ SOLN
INTRAMUSCULAR | Status: DC | PRN
  Administered 2020-12-07: 30 mL

## 2020-12-07 MED ORDER — BISACODYL 10 MG RE SUPP: 10.0000 mg | Freq: Every day | RECTAL | Status: DC | PRN

## 2020-12-07 MED ORDER — PHENYLEPHRINE 40 MCG/ML (10ML) SYRINGE FOR IV PUSH (FOR BLOOD PRESSURE SUPPORT)
PREFILLED_SYRINGE | INTRAVENOUS | Status: AC
  Filled 2020-12-07: qty 10

## 2020-12-07 MED ORDER — PROPOFOL 500 MG/50ML IV EMUL
INTRAVENOUS | Status: DC | PRN
  Administered 2020-12-07: 100 ug/kg/min via INTRAVENOUS

## 2020-12-07 SURGICAL SUPPLY — 44 items
BAG DECANTER FOR FLEXI CONT (MISCELLANEOUS) IMPLANT
BAG ZIPLOCK 12X15 (MISCELLANEOUS) IMPLANT
BLADE SAG 18X100X1.27 (BLADE) ×2 IMPLANT
COVER PERINEAL POST (MISCELLANEOUS) ×2 IMPLANT
COVER SURGICAL LIGHT HANDLE (MISCELLANEOUS) ×2 IMPLANT
COVER WAND RF STERILE (DRAPES) IMPLANT
CUP ACETBLR 54 OD PINNACLE (Hips) ×2 IMPLANT
DECANTER SPIKE VIAL GLASS SM (MISCELLANEOUS) ×2 IMPLANT
DRAPE STERI IOBAN 125X83 (DRAPES) ×2 IMPLANT
DRAPE U-SHAPE 47X51 STRL (DRAPES) ×4 IMPLANT
DRSG AQUACEL AG ADV 3.5X10 (GAUZE/BANDAGES/DRESSINGS) ×2 IMPLANT
DURAPREP 26ML APPLICATOR (WOUND CARE) ×2 IMPLANT
ELECT CAUTERY BLADE TIP 2.5 (TIP) ×2
ELECT REM PT RETURN 15FT ADLT (MISCELLANEOUS) ×2 IMPLANT
ELECTRODE CAUTERY BLDE TIP 2.5 (TIP) ×1 IMPLANT
EVACUATOR 1/8 PVC DRAIN (DRAIN) IMPLANT
GLOVE BIO SURGEON STRL SZ8 (GLOVE) ×2 IMPLANT
GLOVE BIOGEL PI IND STRL 8.5 (GLOVE) ×1 IMPLANT
GLOVE BIOGEL PI INDICATOR 8.5 (GLOVE) ×1
GLOVE SRG 8 PF TXTR STRL LF DI (GLOVE) ×1 IMPLANT
GLOVE SURG ENC MOIS LTX SZ6 (GLOVE) ×2 IMPLANT
GLOVE SURG ENC MOIS LTX SZ7 (GLOVE) ×2 IMPLANT
GLOVE SURG UNDER POLY LF SZ6.5 (GLOVE) ×2 IMPLANT
GLOVE SURG UNDER POLY LF SZ8 (GLOVE) ×2
GOWN STRL REUS W/TWL LRG LVL3 (GOWN DISPOSABLE) ×2 IMPLANT
GOWN STRL REUS W/TWL XL LVL3 (GOWN DISPOSABLE) ×2 IMPLANT
HEAD CERAMIC 36 PLUS5 (Hips) ×2 IMPLANT
HOLDER FOLEY CATH W/STRAP (MISCELLANEOUS) ×2 IMPLANT
KIT TURNOVER KIT A (KITS) IMPLANT
LINER MARATHON NEUT +4X54X36 (Hips) ×2 IMPLANT
MANIFOLD NEPTUNE II (INSTRUMENTS) ×2 IMPLANT
PACK ANTERIOR HIP CUSTOM (KITS) ×2 IMPLANT
PENCIL SMOKE EVACUATOR COATED (MISCELLANEOUS) ×2 IMPLANT
STEM FEMORAL SZ5 HIGH ACTIS (Stem) ×2 IMPLANT
STRIP CLOSURE SKIN 1/2X4 (GAUZE/BANDAGES/DRESSINGS) ×4 IMPLANT
SUT ETHIBOND NAB CT1 #1 30IN (SUTURE) ×2 IMPLANT
SUT MNCRL AB 4-0 PS2 18 (SUTURE) ×2 IMPLANT
SUT STRATAFIX 0 PDS 27 VIOLET (SUTURE) ×2
SUT VIC AB 2-0 CT1 27 (SUTURE) ×4
SUT VIC AB 2-0 CT1 TAPERPNT 27 (SUTURE) ×2 IMPLANT
SUTURE STRATFX 0 PDS 27 VIOLET (SUTURE) ×1 IMPLANT
SYR 50ML LL SCALE MARK (SYRINGE) IMPLANT
TRAY FOLEY MTR SLVR 16FR STAT (SET/KITS/TRAYS/PACK) ×2 IMPLANT
TUBE SUCTION HIGH CAP CLEAR NV (SUCTIONS) ×2 IMPLANT

## 2020-12-07 NOTE — Anesthesia Preprocedure Evaluation (Addendum)
Anesthesia Evaluation  Patient identified by MRN, date of birth, ID band Patient awake    Reviewed: Allergy & Precautions, H&P , NPO status , Patient's Chart, lab work & pertinent test results  Airway Mallampati: III  TM Distance: >3 FB Neck ROM: Full    Dental no notable dental hx. (+) Teeth Intact, Dental Advisory Given   Pulmonary neg pulmonary ROS,    Pulmonary exam normal breath sounds clear to auscultation       Cardiovascular negative cardio ROS   Rhythm:Regular Rate:Normal     Neuro/Psych Anxiety Depression negative neurological ROS     GI/Hepatic Neg liver ROS, GERD  Medicated,  Endo/Other  negative endocrine ROS  Renal/GU negative Renal ROS  negative genitourinary   Musculoskeletal  (+) Arthritis ,   Abdominal   Peds  Hematology negative hematology ROS (+)   Anesthesia Other Findings   Reproductive/Obstetrics negative OB ROS                            Anesthesia Physical Anesthesia Plan  ASA: II  Anesthesia Plan: MAC and Spinal   Post-op Pain Management:    Induction: Intravenous  PONV Risk Score and Plan: 2 and Propofol infusion, Ondansetron and Dexamethasone  Airway Management Planned: Simple Face Mask  Additional Equipment:   Intra-op Plan:   Post-operative Plan:   Informed Consent: I have reviewed the patients History and Physical, chart, labs and discussed the procedure including the risks, benefits and alternatives for the proposed anesthesia with the patient or authorized representative who has indicated his/her understanding and acceptance.     Dental advisory given  Plan Discussed with: CRNA  Anesthesia Plan Comments:         Anesthesia Quick Evaluation

## 2020-12-07 NOTE — Anesthesia Postprocedure Evaluation (Signed)
Anesthesia Post Note  Patient: TAJH LIVSEY  Procedure(s) Performed: TOTAL HIP ARTHROPLASTY ANTERIOR APPROACH (Right Hip)     Patient location during evaluation: PACU Anesthesia Type: MAC and Spinal Level of consciousness: oriented and awake and alert Pain management: pain level controlled Vital Signs Assessment: post-procedure vital signs reviewed and stable Respiratory status: spontaneous breathing, respiratory function stable and patient connected to nasal cannula oxygen Cardiovascular status: blood pressure returned to baseline and stable Postop Assessment: no headache, no backache, no apparent nausea or vomiting, spinal receding and patient able to bend at knees Anesthetic complications: no   No complications documented.  Last Vitals:  Vitals:   12/07/20 1645 12/07/20 1700  BP: (!) 103/59 121/69  Pulse: 72 70  Resp: 18 18  Temp:  36.7 C  SpO2: 98% 97%    Last Pain:  Vitals:   12/07/20 1700  TempSrc:   PainSc: 0-No pain    LLE Motor Response: Purposeful movement (12/07/20 1700)   RLE Motor Response: Responds to commands (12/07/20 1700)   L Sensory Level: S4-Perianal area (12/07/20 1700) R Sensory Level: S4-Perianal area (12/07/20 1700)  Laqueta Bonaventura,W. EDMOND

## 2020-12-07 NOTE — Op Note (Signed)
OPERATIVE REPORT- TOTAL HIP ARTHROPLASTY   PREOPERATIVE DIAGNOSIS: Osteoarthritis of the Right hip.   POSTOPERATIVE DIAGNOSIS: Osteoarthritis of the Right  hip.   PROCEDURE: Right total hip arthroplasty, anterior approach.   SURGEON: Ollen Gross, MD   ASSISTANT: Dennie Bible, PA-C  ANESTHESIA:  Spinal  ESTIMATED BLOOD LOSS:-300 mL    DRAINS: Hemovac x1.   COMPLICATIONS: None   CONDITION: PACU - hemodynamically stable.   BRIEF CLINICAL NOTE: Nicholas Nash is a 50 y.o. male who has advanced end-  stage arthritis of their Right  hip with progressively worsening pain and  dysfunction.The patient has failed nonoperative management and presents for  total hip arthroplasty.   PROCEDURE IN DETAIL: After successful administration of spinal  anesthetic, the traction boots for the [facility] bed were placed on both  feet and the patient was placed onto the [facility] bed, boots placed into the leg  holders. The Right hip was then isolated from the perineum with plastic  drapes and prepped and draped in the usual sterile fashion. ASIS and  greater trochanter were marked and a oblique incision was made, starting  at about 1 cm lateral and 2 cm distal to the ASIS and coursing towards  the anterior cortex of the femur. The skin was cut with a 10 blade  through subcutaneous tissue to the level of the fascia overlying the  tensor fascia lata muscle. The fascia was then incised in line with the  incision at the junction of the anterior third and posterior 2/3rd. The  muscle was teased off the fascia and then the interval between the TFL  and the rectus was developed. The Hohmann retractor was then placed at  the top of the femoral neck over the capsule. The vessels overlying the  capsule were cauterized and the fat on top of the capsule was removed.  A Hohmann retractor was then placed anterior underneath the rectus  femoris to give exposure to the entire anterior capsule. A T-shaped   capsulotomy was performed. The edges were tagged and the femoral head  was identified.       Osteophytes are removed off the superior acetabulum.  The femoral neck was then cut in situ with an oscillating saw. Traction  was then applied to the left lower extremity utilizing the [facility]  traction. The femoral head was then removed. Retractors were placed  around the acetabulum and then circumferential removal of the labrum was  performed. Osteophytes were also removed. Reaming starts at 49 mm to  medialize and  Increased in 2 mm increments to 53 mm. We reamed in  approximately 40 degrees of abduction, 20 degrees anteversion. A 54 mm  pinnacle acetabular shell was then impacted in anatomic position under  fluoroscopic guidance with excellent purchase. We did not need to place  any additional dome screws. A 36 mm neutral + 4 marathon liner was then  placed into the acetabular shell.       The femoral lift was then placed along the lateral aspect of the femur  just distal to the vastus ridge. The leg was  externally rotated and capsule  was stripped off the inferior aspect of the femoral neck down to the  level of the lesser trochanter, this was done with electrocautery. The femur was lifted after this was performed. The  leg was then placed in an extended and adducted position essentially delivering the femur. We also removed the capsule superiorly and the piriformis from the piriformis  fossa to gain excellent exposure of the  proximal femur. Rongeur was used to remove some cancellous bone to get  into the lateral portion of the proximal femur for placement of the  initial starter reamer. The starter broaches was placed  the starter broach  and was shown to go down the center of the canal. Broaching  with the Actis system was then performed starting at size 0  coursing  Up to size 5. A size 5 had excellent torsional and rotational  and axial stability. The trial high offset neck was then placed   with a 36 + 5 trial head. The hip was then reduced. We confirmed that  the stem was in the canal both on AP and lateral x-rays. It also has excellent sizing. The hip was reduced with outstanding stability through full extension and full external rotation.. AP pelvis was taken and the leg lengths were measured and found to be equal. Hip was then dislocated again and the femoral head and neck removed. The  femoral broach was removed. Size 5 Actis stem with a high offset  neck was then impacted into the femur following native anteversion. Has  excellent purchase in the canal. Excellent torsional and rotational and  axial stability. It is confirmed to be in the canal on AP and lateral  fluoroscopic views. The 36 + 5 ceramic head was placed and the hip  reduced with outstanding stability. Again AP pelvis was taken and it  confirmed that the leg lengths were equal. The wound was then copiously  irrigated with saline solution and the capsule reattached and repaired  with Ethibond suture. 30 ml of .25% Bupivicaine was  injected into the capsule and into the edge of the tensor fascia lata as well as subcutaneous tissue. The fascia overlying the tensor fascia lata was then closed with a running #1 V-Loc. Subcu was closed with interrupted 2-0 Vicryl and subcuticular running 4-0 Monocryl. Incision was cleaned  and dried. Steri-Strips and a bulky sterile dressing applied. Hemovac  drain was hooked to suction and then the patient was awakened and transported to  recovery in stable condition.        Please note that a surgical assistant was a medical necessity for this procedure to perform it in a safe and expeditious manner. Assistant was necessary to provide appropriate retraction of vital neurovascular structures and to prevent femoral fracture and allow for anatomic placement of the prosthesis.  Gaynelle Arabian, M.D.

## 2020-12-07 NOTE — Anesthesia Procedure Notes (Signed)
Spinal  Patient location during procedure: OR Start time: 12/07/2020 2:04 PM End time: 12/07/2020 2:09 PM Staffing Performed: resident/CRNA  Anesthesiologist: Roderic Palau, MD Preanesthetic Checklist Completed: patient identified, IV checked, site marked, risks and benefits discussed, surgical consent, monitors and equipment checked, pre-op evaluation and timeout performed Spinal Block Patient position: sitting Prep: DuraPrep Patient monitoring: heart rate, cardiac monitor, continuous pulse ox and blood pressure Approach: midline Location: L3-4 Injection technique: single-shot Needle Needle type: Pencan  Needle gauge: 24 G Needle length: 9 cm Assessment Sensory level: T4 Additional Notes IV functioning, monitors applied to pt. Expiration date of kit checked and confirmed to be in date. Sterile prep and drape, hand hygiene and sterile gloved used. Pt was positioned and spine was prepped in sterile fashion. Skin was anesthetized with lidocaine. Free flow of clear CSF obtained prior to injecting local anesthetic into CSF x 1 attempt. Spinal needle aspirated freely following injection. Needle was carefully withdrawn, and pt tolerated procedure well. Loss of motor and sensory on exam post injection.

## 2020-12-07 NOTE — H&P (Signed)
TOTAL HIP ADMISSION H&P  Patient is admitted for right total hip arthroplasty.  Subjective:  Chief Complaint: right hip pain  HPI: Nicholas Nash, 50 y.o. male, has a history of pain and functional disability in the right hip(s) due to arthritis and patient has failed non-surgical conservative treatments for greater than 12 weeks to include use of assistive devices and activity modification.  Onset of symptoms was gradual starting 2 years ago with gradually worsening course since that time.The patient noted no past surgery on the right hip(s).  Patient currently rates pain in the right hip at 7 out of 10 with activity. Patient has worsening of pain with activity and weight bearing and pain that interfers with activities of daily living. Patient has evidence of joint space narrowing by imaging studies. This condition presents safety issues increasing the risk of falls. There is no current active infection.  Patient Active Problem List   Diagnosis Date Noted  . OA (osteoarthritis) of hip 12/07/2020  . OA (osteoarthritis) of knee 11/13/2017   Past Medical History:  Diagnosis Date  . Anxiety    PTSD  . Arthritis   . Complication of anesthesia    makes him hiccup   . Depression   . GERD (gastroesophageal reflux disease)   . History of kidney stones   . Neuromuscular disorder (HCC)    Sciatica  . Sleep apnea    wears cpap sometimes lost 75lbs    Past Surgical History:  Procedure Laterality Date  . BACK SURGERY     x2  . KNEE ARTHROSCOPY Bilateral 01/2017  . ROTATOR CUFF REPAIR  2000   Right   . TONSILLECTOMY    . TOTAL KNEE ARTHROPLASTY Left 11/13/2017   Procedure: LEFT TOTAL KNEE ARTHROPLASTY;  Surgeon: Ollen Gross, MD;  Location: [facility];  Service: Orthopedics;  Laterality: Left;  block  . TOTAL KNEE ARTHROPLASTY Right 08/11/2018   Procedure: RIGHT TOTAL KNEE ARTHROPLASTY;  Surgeon: Ollen Gross, MD;  Location: [facility];  Service: Orthopedics;  Laterality: Right;    Current  Facility-Administered Medications  Medication Dose Route Frequency Provider Last Rate Last Admin  . acetaminophen (OFIRMEV) IV 1,000 mg  1,000 mg Intravenous Q6H Edmisten, Kristie L, PA      . ceFAZolin (ANCEF) IVPB 2g/100 mL premix  2 g Intravenous On Call Edmisten, Kristie L, PA      . dexamethasone (DECADRON) injection 8 mg  8 mg Intravenous Once Edmisten, Kristie L, PA      . lactated ringers infusion   Intravenous Continuous Derenda Fennel, PA 75 mL/hr at 12/07/20 1239 New Bag at 12/07/20 1239  . lactated ringers infusion   Intravenous Continuous Eilene Ghazi, MD      . tranexamic acid (CYKLOKAPRON) IVPB 1,000 mg  1,000 mg Intravenous To OR Edmisten, Kristie L, PA       Facility-Administered Medications Ordered in Other Encounters  Medication Dose Route Frequency Provider Last Rate Last Admin  . lactated ringers infusion   Intravenous Continuous PRN Lorelee Market, CRNA   New Bag at 12/07/20 1244   No Known Allergies  Social History   Tobacco Use  . Smoking status: Never Smoker  . Smokeless tobacco: Never Used  Substance Use Topics  . Alcohol use: No    History reviewed. No pertinent family history.   Review of Systems  Constitutional: Negative for chills and fever.  Respiratory: Negative for cough and shortness of breath.   Cardiovascular: Negative for chest pain.  Gastrointestinal: Negative for  nausea and vomiting.  Musculoskeletal: Positive for arthralgias.    Objective:  Physical Exam Patient is a 50 year old male.  Well nourished and well developed. General: Alert and oriented x3, cooperative and pleasant, no acute distress. Head: normocephalic, atraumatic, neck supple. Eyes: EOMI. Respiratory: breath sounds clear in all fields, no wheezing, rales, or rhonchi. Cardiovascular: Regular rate and rhythm, no murmurs, gallops or rubs. Abdomen: non-tender to palpation and soft, normoactive bowel sounds. Musculoskeletal:  Right Hip: No tenderness to palpation  about the right greater trochanteric bursa. Pain with passive and active motion of the right hip. ROM 0-110 degrees flexion, 15 degrees internal rotation, 15 degrees external rotation, and 20 degrees abduction.  Calves soft and nontender. Motor function intact in LE. Strength 5/5 LE bilaterally. Neuro: Distal pulses 2+. Sensation to light touch intact in LE.  AP pelvis and lateral XR of the right hip dated 10/07/20 show severe right hip osteoarthritis with slight flattening of the femoral head.  Vital signs in last 24 hours: Temp:  [98.2 F (36.8 C)] 98.2 F (36.8 C) (01/05 1217) Pulse Rate:  [78] 78 (01/05 1217) Resp:  [18] 18 (01/05 1217) BP: (142)/(94) 142/94 (01/05 1217) SpO2:  [100 %] 100 % (01/05 1217)  Labs:   Estimated body mass index is 31.18 kg/m as calculated from the following:   Height as of 11/29/20: 6\' 4"  (1.93 m).   Weight as of 11/29/20: 116.2 kg.   Imaging Review Plain radiographs demonstrate severe degenerative joint disease of the right hip(s). The bone quality appears to be adequate for age and reported activity level.   Assessment/Plan:  End stage arthritis, right hip(s)  The patient history, physical examination, clinical judgement of the provider and imaging studies are consistent with end stage degenerative joint disease of the right hip(s) and total hip arthroplasty is deemed medically necessary. The treatment options including medical management, injection therapy, arthroscopy and arthroplasty were discussed at length. The risks and benefits of total hip arthroplasty were presented and reviewed. The risks due to aseptic loosening, infection, stiffness, dislocation/subluxation,  thromboembolic complications and other imponderables were discussed.  The patient acknowledged the explanation, agreed to proceed with the plan and consent was signed. Patient is being admitted for inpatient treatment for surgery, pain control, PT, OT, prophylactic antibiotics, VTE  prophylaxis, progressive ambulation and ADL's and discharge planning.The patient is planning to be discharged home.    Therapy Plans: HEP Disposition: Home with wife Planned DVT Prophylaxis: Aspirin 325 mg BID DME Needed: None PCP: Dr. 12/01/20 (clearance received) TXA: IV Allergies: NKDA Anesthesia Concerns: None BMI: 31.7 Last HgbA1c: Not diabetic. Pharmacy: [facility])  Other: History of hiccups following anesthesia with previous TKAs   - Patient was instructed on what medications to stop prior to surgery. - Follow-up visit in 2 weeks with [facility]. [facility] - Begin physical therapy following surgery - Pre-operative lab work as pre-surgical testing - Prescriptions will be provided in hospital at time of discharge  Lequita Halt, PA-C Orthopedic Surgery [facility] [place] [phone]

## 2020-12-07 NOTE — Progress Notes (Signed)
Pt declined cpap

## 2020-12-07 NOTE — Interval H&P Note (Signed)
History and Physical Interval Note:  12/07/2020 1:57 PM  Nicholas Nash  has presented today for surgery, with the diagnosis of right hip osteoarthritis.  The various methods of treatment have been discussed with the patient and family. After consideration of risks, benefits and other options for treatment, the patient has consented to  Procedure(s) with comments: TOTAL HIP ARTHROPLASTY ANTERIOR APPROACH (Right) - as a surgical intervention.  The patient's history has been reviewed, patient examined, no change in status, stable for surgery.  I have reviewed the patient's chart and labs.  Questions were answered to the patient's satisfaction.     Homero Fellers Mckenize Mezera

## 2020-12-07 NOTE — Transfer of Care (Signed)
Immediate Anesthesia Transfer of Care Note  Patient: Nicholas Nash  Procedure(s) Performed: TOTAL HIP ARTHROPLASTY ANTERIOR APPROACH (Right Hip)  Patient Location: PACU  Anesthesia Type:Spinal  Level of Consciousness: drowsy  Airway & Oxygen Therapy: Patient Spontanous Breathing and Patient connected to face mask oxygen  Post-op Assessment: Report given to RN and Post -op Vital signs reviewed and stable  Post vital signs: Reviewed and stable  Last Vitals:  Vitals Value Taken Time  BP 103/66 12/07/20 1608  Temp    Pulse 64 12/07/20 1613  Resp 11 12/07/20 1613  SpO2 94 % 12/07/20 1613  Vitals shown include unvalidated device data.  Last Pain:  Vitals:   12/07/20 1224  TempSrc:   PainSc: 0-No pain      Patients Stated Pain Goal: 3 (12/07/20 1224)  Complications: No complications documented.

## 2020-12-07 NOTE — Discharge Instructions (Signed)
 Nicholas Aluisio, MD Total Joint Specialist [facility] [place] [address] [place], [place] [address] [phone]  TOTAL KNEE REPLACEMENT POSTOPERATIVE DIRECTIONS    Knee Rehabilitation, Guidelines Following Surgery  Results after knee surgery are often greatly improved when you follow the exercise, range of motion and muscle strengthening exercises prescribed by your doctor. Safety measures are also important to protect the knee from further injury. If any of these exercises cause you to have increased pain or swelling in your knee joint, decrease the amount until you are comfortable again and slowly increase them. If you have problems or questions, call your caregiver or physical therapist for advice.   BLOOD CLOT PREVENTION . Take a 325 mg Aspirin two times a day for three weeks following surgery. Then take an 81 mg Aspirin once a day for three weeks. Then discontinue Aspirin. . You may resume your vitamins/supplements upon discharge from the hospital. . Do not take any NSAIDs (Advil, Aleve, Ibuprofen, Meloxicam, etc.) until you have discontinued the 325 mg Aspirin.  HOME CARE INSTRUCTIONS  . Remove items at home which could result in a fall. This includes throw rugs or furniture in walking pathways.  . ICE to the affected knee as much as tolerated. Icing helps control swelling. If the swelling is well controlled you will be more comfortable and rehab easier. Continue to use ice on the knee for pain and swelling from surgery. You may notice swelling that will progress down to the foot and ankle. This is normal after surgery. Elevate the leg when you are not up walking on it.    . Continue to use the breathing machine which will help keep your temperature down. It is common for your temperature to cycle up and down following surgery, especially at night when you are not up moving around and exerting yourself. The breathing machine keeps your lungs expanded and your  temperature down. . Do not place pillow under the operative knee, focus on keeping the knee straight while resting  DIET You may resume your previous home diet once you are discharged from the hospital.  DRESSING / WOUND CARE / SHOWERING . Keep your bulky bandage on for 2 days. On the third post-operative day you may remove the Ace bandage and gauze. There is a waterproof adhesive bandage on your skin which will stay in place until your first follow-up appointment. Once you remove this you will not need to place another bandage . You may begin showering 3 days following surgery, but do not submerge the incision under water.  ACTIVITY For the first 5 days, the key is rest and control of pain and swelling . Do your home exercises twice a day starting on post-operative day 3. On the days you go to physical therapy, just do the home exercises once that day. . You should rest, ice and elevate the leg for 50 minutes out of every hour. Get up and walk/stretch for 10 minutes per hour. After 5 days you can increase your activity slowly as tolerated. . Walk with your walker as instructed. Use the walker until you are comfortable transitioning to a cane. Walk with the cane in the opposite hand of the operative leg. You may discontinue the cane once you are comfortable and walking steadily. . Avoid periods of inactivity such as sitting longer than an hour when not asleep. This helps prevent blood clots.  . You may discontinue the knee immobilizer once you are able to perform a straight   leg raise while lying down. . You may resume a sexual relationship in one month or when given the OK by your doctor.  . You may return to work once you are cleared by your doctor.  . Do not drive a car for 6 weeks or until released by your surgeon.  . Do not drive while taking narcotics.  TED HOSE STOCKINGS Wear the elastic stockings on both legs for three weeks following surgery during the day. You may remove them at night  for sleeping.  WEIGHT BEARING Weight bearing as tolerated with assist device (walker, cane, etc) as directed, use it as long as suggested by your surgeon or therapist, typically at least 4-6 weeks.  POSTOPERATIVE CONSTIPATION PROTOCOL Constipation - defined medically as fewer than three stools per week and severe constipation as less than one stool per week.  One of the most common issues patients have following surgery is constipation.  Even if you have a regular bowel pattern at home, your normal regimen is likely to be disrupted due to multiple reasons following surgery.  Combination of anesthesia, postoperative narcotics, change in appetite and fluid intake all can affect your bowels.  In order to avoid complications following surgery, here are some recommendations in order to help you during your recovery period.  . Colace (docusate) - Pick up an over-the-counter form of Colace or another stool softener and take twice a day as long as you are requiring postoperative pain medications.  Take with a full glass of water daily.  If you experience loose stools or diarrhea, hold the colace until you stool forms back up. If your symptoms do not get better within 1 week or if they get worse, check with your doctor. . Dulcolax (bisacodyl) - Pick up over-the-counter and take as directed by the product packaging as needed to assist with the movement of your bowels.  Take with a full glass of water.  Use this product as needed if not relieved by Colace only.  . MiraLax (polyethylene glycol) - Pick up over-the-counter to have on hand. MiraLax is a solution that will increase the amount of water in your bowels to assist with bowel movements.  Take as directed and can mix with a glass of water, juice, soda, coffee, or tea. Take if you go more than two days without a movement. Do not use MiraLax more than once per day. Call your doctor if you are still constipated or irregular after using this medication for 7 days  in a row.  If you continue to have problems with postoperative constipation, please contact the office for further assistance and recommendations.  If you experience "the worst abdominal pain ever" or develop nausea or vomiting, please contact the office immediatly for further recommendations for treatment.  ITCHING If you experience itching with your medications, try taking only a single pain pill, or even half a pain pill at a time.  You can also use Benadryl over the counter for itching or also to help with sleep.   MEDICATIONS See your medication summary on the "After Visit Summary" that the nursing staff will review with you prior to discharge.  You may have some home medications which will be placed on hold until you complete the course of blood thinner medication.  It is important for you to complete the blood thinner medication as prescribed by your surgeon.  Continue your approved medications as instructed at time of discharge.  PRECAUTIONS . If you experience chest pain or shortness of   breath - call 911 immediately for transfer to the hospital emergency department.  . If you develop a fever greater that 101 F, purulent drainage from wound, increased redness or drainage from wound, foul odor from the wound/dressing, or calf pain - CONTACT YOUR SURGEON.                                                   FOLLOW-UP APPOINTMENTS Make sure you keep all of your appointments after your operation with your surgeon and caregivers. You should call the office at the above phone number and make an appointment for approximately two weeks after the date of your surgery or on the date instructed by your surgeon outlined in the "After Visit Summary".  RANGE OF MOTION AND STRENGTHENING EXERCISES  Rehabilitation of the knee is important following a knee injury or an operation. After just a few days of immobilization, the muscles of the thigh which control the knee become weakened and shrink (atrophy). Knee  exercises are designed to build up the tone and strength of the thigh muscles and to improve knee motion. Often times heat used for twenty to thirty minutes before working out will loosen up your tissues and help with improving the range of motion but do not use heat for the first two weeks following surgery. These exercises can be done on a training (exercise) mat, on the floor, on a table or on a bed. Use what ever works the best and is most comfortable for you Knee exercises include:  . Leg Lifts - While your knee is still immobilized in a splint or cast, you can do straight leg raises. Lift the leg to 60 degrees, hold for 3 sec, and slowly lower the leg. Repeat 10-20 times 2-3 times daily. Perform this exercise against resistance later as your knee gets better.  . Quad and Hamstring Sets - Tighten up the muscle on the front of the thigh (Quad) and hold for 5-10 sec. Repeat this 10-20 times hourly. Hamstring sets are done by pushing the foot backward against an object and holding for 5-10 sec. Repeat as with quad sets.   Leg Slides: Lying on your back, slowly slide your foot toward your buttocks, bending your knee up off the floor (only go as far as is comfortable). Then slowly slide your foot back down until your leg is flat on the floor again.  Angel Wings: Lying on your back spread your legs to the side as far apart as you can without causing discomfort.  A rehabilitation program following serious knee injuries can speed recovery and prevent re-injury in the future due to weakened muscles. Contact your doctor or a physical therapist for more information on knee rehabilitation.   IF YOU ARE TRANSFERRED TO A SKILLED REHAB FACILITY If the patient is transferred to a skilled rehab facility following release from the hospital, a list of the current medications will be sent to the facility for the patient to continue.  When discharged from the skilled rehab facility, please have the facility set up the  patient's Home Health Physical Therapy prior to being released. Also, the skilled facility will be responsible for providing the patient with their medications at time of release from the facility to include their pain medication, the muscle relaxants, and their blood thinner medication. If the patient is still at the   rehab facility at time of the two week follow up appointment, the skilled rehab facility will also need to assist the patient in arranging follow up appointment in our office and any transportation needs.  MAKE SURE YOU:  . Understand these instructions.  . Get help right away if you are not doing well or get worse.   DENTAL ANTIBIOTICS:  In most cases prophylactic antibiotics for Dental procdeures after total joint surgery are not necessary.  Exceptions are as follows:  1. History of prior total joint infection  2. Severely immunocompromised (Organ Transplant, cancer chemotherapy, Rheumatoid biologic meds such as Humera)  3. Poorly controlled diabetes (A1C &gt; 8.0, blood glucose over 200)  If you have one of these conditions, contact your surgeon for an antibiotic prescription, prior to your dental procedure.    Pick up stool softner and laxative for home use following surgery while on pain medications. Do not submerge incision under water. Please use good hand washing techniques while changing dressing each day. May shower starting three days after surgery. Please use a clean towel to pat the incision dry following showers. Continue to use ice for pain and swelling after surgery. Do not use any lotions or creams on the incision until instructed by your surgeon.  

## 2020-12-08 ENCOUNTER — Encounter (HOSPITAL_COMMUNITY): Payer: Self-pay | Admitting: Orthopedic Surgery

## 2020-12-08 DIAGNOSIS — M1611 Unilateral primary osteoarthritis, right hip: Secondary | ICD-10-CM | POA: Diagnosis not present

## 2020-12-08 LAB — BASIC METABOLIC PANEL
Anion gap: 12 (ref 5–15)
BUN: 14 mg/dL (ref 6–20)
CO2: 23 mmol/L (ref 22–32)
Calcium: 8.7 mg/dL — ABNORMAL LOW (ref 8.9–10.3)
Chloride: 103 mmol/L (ref 98–111)
Creatinine, Ser: 1.15 mg/dL (ref 0.61–1.24)
GFR, Estimated: >60 mL/min (ref >60)
Glucose, Bld: 125 mg/dL — ABNORMAL HIGH (ref 70–99)
Potassium: 3.8 mmol/L (ref 3.5–5.1)
Sodium: 138 mmol/L (ref 135–145)

## 2020-12-08 LAB — CBC
HCT: 38 % — ABNORMAL LOW (ref 39.0–52.0)
Hemoglobin: 12.5 g/dL — ABNORMAL LOW (ref 13.0–17.0)
MCH: 31.2 pg (ref 26.0–34.0)
MCHC: 32.9 g/dL (ref 30.0–36.0)
MCV: 94.8 fL (ref 80.0–100.0)
Platelets: 280 10*3/uL (ref 150–400)
RBC: 4.01 MIL/uL — ABNORMAL LOW (ref 4.22–5.81)
RDW: 12.6 % (ref 11.5–15.5)
WBC: 15.6 10*3/uL — ABNORMAL HIGH (ref 4.0–10.5)
nRBC: 0 % (ref 0.0–0.2)

## 2020-12-08 MED ORDER — METHOCARBAMOL 500 MG PO TABS: 500.0000 mg | ORAL_TABLET | Freq: Four times a day (QID) | ORAL | 0 refills | Status: DC | PRN

## 2020-12-08 MED ORDER — ASPIRIN 325 MG PO TBEC: 325.0000 mg | DELAYED_RELEASE_TABLET | Freq: Two times a day (BID) | ORAL | 0 refills | Status: AC

## 2020-12-08 MED ORDER — OXYCODONE HCL 5 MG PO TABS: 5.0000 mg | ORAL_TABLET | Freq: Four times a day (QID) | ORAL | 0 refills | Status: DC | PRN

## 2020-12-08 NOTE — Evaluation (Signed)
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  Clinical Impression  Pt is s/p AA R THA resulting in the deficits listed below (see PT Problem List). Pt is mod I/SUPV with all mobility, cued for safety with mobility due to slightly impulsive rising to stand and maneuver RW prior to therapist's adjusting to pt height and without appropriate non-slip footing. Pt with good family support at home and previous R and L TKA, knowledgeable on road to recovery and able to mobilize without LOB or falls. Pt demonstrates some RLE hip weakness with bed mobility using BUE to assist with mobility, but good functional strength noted with transfers and ambulation. Pt educated on safety with mobility and verbalizes understanding- RN notified of pt's successful PT session and plan to d/c.     Follow Up Recommendations Follow surgeon's recommendation for DC plan and follow-up therapies    Equipment Recommendations  None recommended by PT    Recommendations for Other Services       Precautions / Restrictions Precautions Precautions: Anterior Hip;Fall Restrictions Weight Bearing Restrictions: No      Mobility  Bed Mobility Overal bed mobility: Modified Independent  General bed mobility comments: BUE assisting RLE off of/onto bed from flat bed, no physical assist or cues    Transfers Overall transfer level: Needs assistance Equipment used: Rolling walker (2 wheeled) Transfers: Sit to/from Stand Sit to Stand: Supervision  General transfer comment: slightly impulsive with rising from EOB and reaching for RW, cued for decreased speed to allow therapist to raise RW to appropriate height and for therapist to grab appropriate non-slip  footing  Ambulation/Gait Ambulation/Gait assistance: Supervision Gait Distance (Feet): 180 Feet Assistive device: Rolling walker (2 wheeled) Gait Pattern/deviations: Step-to pattern;Decreased stride length;Trunk flexed Gait velocity: decreased   General Gait Details: step to pattern, trunk slightly flexed forward, slightly impulsive with turns but able to maintain RW at appropriate distance, no unsteadiness or overt LOB  Stairs            Wheelchair Mobility    Modified Rankin (Stroke Patients Only)       Balance Overall balance assessment: Mild deficits observed, not formally tested          Pertinent Vitals/Pain Pain Assessment: 0-10 Pain Score: 8  Pain Location: R hip Pain Descriptors / Indicators: Aching;Sore Pain Intervention(s): Limited activity within patient's tolerance;Monitored during session;Premedicated before session;Repositioned;Ice applied    Home Living Family/patient expects to be discharged to:: Private residence Living Arrangements: Spouse/significant other Available Help at Discharge: Family;Available PRN/intermittently Type of Home: House Home Access: Stairs to enter Entrance Stairs-Rails: Psychiatric nurse of Steps: 5 Home Layout: One level Home Equipment: Walker - 2 wheels;Bedside commode;Cane - single point;Shower seat Additional Comments: Pt reports spouse is nurse and can assist him and get equipment if needed    Prior Function Level of Independence: Independent         Comments: Pt reports independent with ADLs, community ambulation, drives, rides 4 wheeler, retired.     Hand Dominance        Extremity/Trunk Assessment   Upper Extremity Assessment Upper Extremity Assessment: Overall WFL for tasks assessed    Lower Extremity Assessment Lower Extremity Assessment: RLE deficits/detail RLE Deficits / Details: strength 2+/5 in R  hip, ankle and knee grossly 3+/5    Cervical / Trunk Assessment Cervical /  Trunk Assessment: Normal  Communication   Communication: No difficulties  Cognition Arousal/Alertness: Awake/alert Behavior During Therapy: WFL for tasks assessed/performed Overall Cognitive Status: Within Functional Limits for tasks assessed       General Comments      Exercises     Assessment/Plan    PT Assessment All further PT needs can be met in the next venue of care  PT Problem List Decreased strength;Decreased activity tolerance;Decreased balance;Pain       PT Treatment Interventions      PT Goals (Current goals can be found in the Care Plan section)  Acute Rehab PT Goals Patient Stated Goal: "get L hip done" PT Goal Formulation: With patient Time For Goal Achievement: 12/22/20 Potential to Achieve Goals: Good    Frequency     Barriers to discharge        Co-evaluation               AM-PAC PT "6 Clicks" Mobility  Outcome Measure Help needed turning from your back to your side while in a flat bed without using bedrails?: None Help needed moving from lying on your back to sitting on the side of a flat bed without using bedrails?: None Help needed moving to and from a bed to a chair (including a wheelchair)?: None Help needed standing up from a chair using your arms (e.g., wheelchair or bedside chair)?: None Help needed to walk in hospital room?: None Help needed climbing 3-5 steps with a railing? : A Little 6 Click Score: 23    End of Session   Activity Tolerance: Patient tolerated treatment well Patient left: in bed;with call bell/phone within reach Nurse Communication: Mobility status PT Visit Diagnosis: Other abnormalities of gait and mobility (R26.89);Pain Pain - Right/Left: Right Pain - part of body: Hip    Time: 7096-2836 PT Time Calculation (min) (ACUTE ONLY): 14 min   Charges:   PT Evaluation $PT Eval Low Complexity: 1 Low           Tori Meya Clutter PT, DPT 12/08/20, 11:09 AM

## 2020-12-08 NOTE — Progress Notes (Signed)
   Subjective: 1 Day Post-Op Procedure(s) (LRB): TOTAL HIP ARTHROPLASTY ANTERIOR APPROACH (Right) Patient reports pain as mild.   Patient seen in rounds by Dr. Lequita Halt. Patient is well, and has had no acute complaints or problems. No acute overnight events. Patient stated that he was dealing with some discomfort in his back, but repositioning throughout the night helped alleviate his pain, and he is not currently experiencing the same discomfort. Denies SOB, chest pain, or calf pain. Patient's pain medication regiment was switched to Oxycodone which has decreased his pain.  We will start physical therapy today.   Objective: Vital signs in last 24 hours: Temp:  [97.5 F (36.4 C)-98.4 F (36.9 C)] 97.7 F (36.5 C) (01/06 0533) Pulse Rate:  [62-78] 68 (01/06 0533) Resp:  [12-18] 16 (01/06 0533) BP: (96-142)/(58-94) 117/79 (01/06 0533) SpO2:  [93 %-100 %] 100 % (01/06 0533)  Intake/Output from previous day:  Intake/Output Summary (Last 24 hours) at 12/08/2020 0727 Last data filed at 12/08/2020 0530 Gross per 24 hour  Intake 2037 ml  Output 1725 ml  Net 312 ml     Intake/Output this shift: No intake/output data recorded.  Labs: Recent Labs    12/08/20 0609  HGB 12.5*   Recent Labs    12/08/20 0609  WBC 15.6*  RBC 4.01*  HCT 38.0*  PLT 280   Recent Labs    12/08/20 0609  NA 138  K 3.8  CL 103  CO2 23  BUN 14  CREATININE 1.15  GLUCOSE 125*  CALCIUM 8.7*   No results for input(s): LABPT, INR in the last 72 hours.  Exam: General - Patient is Alert, Appropriate and Oriented Extremity - Neurologically intact Neurovascular intact Sensation intact distally Intact pulses distally Dorsiflexion/Plantar flexion intact Dressing - dressing C/D/I Motor Function - intact, moving foot and toes well on exam.   Past Medical History:  Diagnosis Date  . Anxiety    PTSD  . Arthritis   . Complication of anesthesia    makes him hiccup   . Depression   . GERD  (gastroesophageal reflux disease)   . History of kidney stones   . Neuromuscular disorder (HCC)    Sciatica  . Sleep apnea    wears cpap sometimes lost 75lbs    Assessment/Plan: 1 Day Post-Op Procedure(s) (LRB): TOTAL HIP ARTHROPLASTY ANTERIOR APPROACH (Right) Principal Problem:   OA (osteoarthritis) of hip Active Problems:   Primary osteoarthritis of right hip  Estimated body mass index is 31.18 kg/m as calculated from the following:   Height as of 11/29/20: 6\' 4"  (1.93 m).   Weight as of 11/29/20: 116.2 kg. Advance diet Up with therapy  DVT Prophylaxis - Aspirin and TED hose Weight bearing as tolerated. Continue therapy.  Plan is to go Home after hospital stay. Patient to be seen by physical therapy this morning, and if meeting goals, will plan for discharge. Patient to follow up with Dr. 12/01/20 in two weeks.   The PDMP database was reviewed today prior to any opioid medications being prescribed to this patient.  Lequita Halt, PA-C Orthopedic Surgery 12/08/2020, 7:27 AM

## 2020-12-08 NOTE — Progress Notes (Signed)
Pt alert and oriented. Tolerating diet. D/C instructions given. Pt d/cd home. 

## 2020-12-13 NOTE — Discharge Summary (Signed)
Physician Discharge Summary   Patient ID: Nicholas Nash MRN: [id number] DOB/AGE: August 11, 1971 50 y.o.  Admit date: 12/07/2020 Discharge date: 12/08/2020  Primary Diagnosis: Osteoarthritis, right hip   Admission Diagnoses:  Past Medical History:  Diagnosis Date  . Anxiety    PTSD  . Arthritis   . Complication of anesthesia    makes him hiccup   . Depression   . GERD (gastroesophageal reflux disease)   . History of kidney stones   . Neuromuscular disorder (HCC)    Sciatica  . Sleep apnea    wears cpap sometimes lost 75lbs   Discharge Diagnoses:   Principal Problem:   OA (osteoarthritis) of hip Active Problems:   Primary osteoarthritis of right hip  Estimated body mass index is 31.18 kg/m as calculated from the following:   Height as of 11/29/20: 6\' 4"  (1.93 m).   Weight as of 11/29/20: 116.2 kg.  Procedure:  Procedure(s) (LRB): TOTAL HIP ARTHROPLASTY ANTERIOR APPROACH (Right)   Consults: None  HPI: Nicholas Nash is a 50 y.o. male who has advanced end-stage arthritis of their Right  hip with progressively worsening pain and dysfunction.The patient has failed nonoperative management and presents for total hip arthroplasty.   Laboratory Data: Admission on 12/07/2020, Discharged on 12/08/2020  Component Date Value Ref Range Status  . WBC 12/08/2020 15.6* 4.0 - 10.5 K/uL Final  . RBC 12/08/2020 4.01* 4.22 - 5.81 MIL/uL Final  . Hemoglobin 12/08/2020 12.5* 13.0 - 17.0 g/dL Final  . HCT 02/05/2021 38.0* 39.0 - 52.0 % Final  . MCV 12/08/2020 94.8  80.0 - 100.0 fL Final  . MCH 12/08/2020 31.2  26.0 - 34.0 pg Final  . MCHC 12/08/2020 32.9  30.0 - 36.0 g/dL Final  . RDW 02/05/2021 12.6  11.5 - 15.5 % Final  . Platelets 12/08/2020 280  150 - 400 K/uL Final  . nRBC 12/08/2020 0.0  0.0 - 0.2 % Final   Performed at [facility], [address], [place], [place]  . Sodium 12/08/2020 138  135 - 145 mmol/L Final  . Potassium 12/08/2020 3.8  3.5 -  5.1 mmol/L Final  . Chloride 12/08/2020 103  98 - 111 mmol/L Final  . CO2 12/08/2020 23  22 - 32 mmol/L Final  . Glucose, Bld 12/08/2020 125* 70 - 99 mg/dL Final   Glucose reference range applies only to samples taken after fasting for at least 8 hours.  . BUN 12/08/2020 14  6 - 20 mg/dL Final  . Creatinine, Ser 12/08/2020 1.15  0.61 - 1.24 mg/dL Final  . Calcium 02/05/2021 8.7* 8.9 - 10.3 mg/dL Final  . GFR, Estimated 12/08/2020 >60  >60 mL/min Final   Comment: (NOTE) Calculated using the CKD-EPI Creatinine Equation (2021)   . Anion gap 12/08/2020 12  5 - 15 Final   Performed at [facility], [address] [place], [place]  Hospital Outpatient Visit on 12/05/2020  Component Date Value Ref Range Status  . SARS Coronavirus 2 12/05/2020 NEGATIVE  NEGATIVE Final   Comment: (NOTE) SARS-CoV-2 target nucleic acids are NOT DETECTED.  The SARS-CoV-2 RNA is generally detectable in upper and lower respiratory specimens during the acute phase of infection. Negative results do not preclude SARS-CoV-2 infection, do not rule out co-infections with other pathogens, and should not be used as the sole basis for treatment or other patient management decisions. Negative results must be combined with clinical observations, patient history, and epidemiological information. The expected result  is Negative.  Fact Sheet for Patients: HairSlick.no  Fact Sheet for Healthcare Providers: quierodirigir.com  This test is not yet approved or cleared by the [place] FDA and  has been authorized for detection and/or diagnosis of SARS-CoV-2 by FDA under an Emergency Use Authorization (EUA). This EUA will remain  in effect (meaning this test can be used) for the duration of the COVID-19 declaration under Se                          ction 564(b)(1) of the Act, 21 U.S.C. section 360bbb-3(b)(1), unless the authorization is  terminated or revoked sooner.  Performed at [facility], [address], [place], [place] [address]   Hospital Outpatient Visit on 11/29/2020  Component Date Value Ref Range Status  . MRSA, PCR 11/29/2020 NEGATIVE  NEGATIVE Final  . Staphylococcus aureus 11/29/2020 POSITIVE* NEGATIVE Final   Comment: (NOTE) The Xpert SA Assay (FDA approved for NASAL specimens in patients 23 years of age and older), is one component of a comprehensive surveillance program. It is not intended to diagnose infection nor to guide or monitor treatment. Performed at [facility], [address] [place], [place] [address]   . WBC 11/29/2020 6.0  4.0 - 10.5 K/uL Final  . RBC 11/29/2020 4.38  4.22 - 5.81 MIL/uL Final  . Hemoglobin 11/29/2020 13.9  13.0 - 17.0 g/dL Final  . HCT 57/84/6962 40.7  39.0 - 52.0 % Final  . MCV 11/29/2020 92.9  80.0 - 100.0 fL Final  . MCH 11/29/2020 31.7  26.0 - 34.0 pg Final  . MCHC 11/29/2020 34.2  30.0 - 36.0 g/dL Final  . RDW 95/28/4132 12.8  11.5 - 15.5 % Final  . Platelets 11/29/2020 278  150 - 400 K/uL Final  . nRBC 11/29/2020 0.0  0.0 - 0.2 % Final   Performed at [facility], [address] [place], [place] [address]  . Sodium 11/29/2020 142  135 - 145 mmol/L Final  . Potassium 11/29/2020 3.9  3.5 - 5.1 mmol/L Final  . Chloride 11/29/2020 107  98 - 111 mmol/L Final  . CO2 11/29/2020 27  22 - 32 mmol/L Final  . Glucose, Bld 11/29/2020 93  70 - 99 mg/dL Final   Glucose reference range applies only to samples taken after fasting for at least 8 hours.  . BUN 11/29/2020 22* 6 - 20 mg/dL Final  . Creatinine, Ser 11/29/2020 1.28* 0.61 - 1.24 mg/dL Final  . Calcium 27/25/3664 9.3  8.9 - 10.3 mg/dL Final  . Total Protein 11/29/2020 6.8  6.5 - 8.1 g/dL Final  . Albumin 40/34/7425 4.3  3.5 - 5.0 g/dL Final  . AST 95/63/8756 23  15 - 41 U/L Final  . ALT 11/29/2020 21  0 - 44 U/L Final  . Alkaline Phosphatase 11/29/2020 64  38 - 126  U/L Final  . Total Bilirubin 11/29/2020 0.5  0.3 - 1.2 mg/dL Final  . GFR, Estimated 11/29/2020 >60  >60 mL/min Final   Comment: (NOTE) Calculated using the CKD-EPI Creatinine Equation (2021)   . Anion gap 11/29/2020 8  5 - 15 Final   Performed at [facility], [address] [place], [place] [address]  . Prothrombin Time 11/29/2020 12.6  11.4 - 15.2 seconds Final  . INR 11/29/2020 1.0  0.8 - 1.2 Final   Comment: (NOTE) INR goal varies based on device and disease states. Performed at [facility], [address] [place], [place] [address]   . aPTT 11/29/2020 25  24 - 36 seconds Final   Performed at [facility], [address] [place], [place] [address]  . ABO/RH(D) 11/29/2020 A NEG   Final  . Antibody Screen 11/29/2020 NEG   Final  . Sample Expiration 11/29/2020 12/10/2020,2359   Final  . Extend sample reason 11/29/2020    Final                   Value:NO TRANSFUSIONS OR PREGNANCY IN THE PAST 3 MONTHS Performed at [facility], [address] [place], [place] [address]      X-Rays:DG Pelvis Portable  Result Date: 12/07/2020 CLINICAL DATA:  Postop hip replacement EXAM: PORTABLE PELVIS 1-2 VIEWS COMPARISON:  None. FINDINGS: Right hip replacement in satisfactory position and alignment. No fracture or complication. Left hip normal. IMPRESSION: Satisfactory right hip replacement. Electronically Signed   By: Marlan Palauharles  Clark M.D.   On: 12/07/2020 17:12   DG C-Arm 1-60 Min-No Report  Result Date: 12/07/2020 Fluoroscopy was utilized by the requesting physician.  No radiographic interpretation.   DG HIP OPERATIVE UNILAT W OR W/O PELVIS RIGHT  Result Date: 12/07/2020 CLINICAL DATA:  RIGHT hip replacement EXAM: OPERATIVE RIGHT HIP (WITH PELVIS IF PERFORMED) 2 VIEWS TECHNIQUE: Fluoroscopic spot image(s) were submitted for interpretation post-operatively. COMPARISON:  None FLUOROSCOPY TIME:  0 minutes 10 seconds Dose:  3.0332 mGy FINDINGS: Osseous demineralization. First image demonstrates acetabular cup and femoral reamer. Components of a RIGHT hip prosthesis are identified without fracture or dislocation identified on additional AP view. IMPRESSION: RIGHT hip prosthesis without acute complication on AP imaging. Electronically Signed   By: Nicholas SouthwardMark  Nash M.D.   On: 12/07/2020 15:54    EKG:No orders found for this or any previous visit.   Hospital Course: Nicholas NashMichael W Nash is a 50 y.o. who was [facility]. They were brought to the operating room on 12/07/2020 and underwent Procedure(s): TOTAL HIP ARTHROPLASTY ANTERIOR APPROACH.  Patient tolerated the procedure well and was later transferred to the recovery room and then to the orthopaedic floor for postoperative care. They were given PO and IV analgesics for pain control following their surgery. They were given 24 hours of postoperative antibiotics of  Anti-infectives (From admission, onward)   Start     Dose/Rate Route Frequency Ordered Stop   12/07/20 2000  ceFAZolin (ANCEF) IVPB 2g/100 mL premix        2 g 200 mL/hr over 30 Minutes Intravenous Every 6 hours 12/07/20 1836 12/08/20 0412   12/07/20 1215  ceFAZolin (ANCEF) IVPB 2g/100 mL premix        2 g 200 mL/hr over 30 Minutes Intravenous On call 12/07/20 1203 12/07/20 1410     and started on DVT prophylaxis in the form of Aspirin.   PT and OT were ordered for total joint protocol. Discharge planning consulted to help with postop disposition and equipment needs.  Patient had a good night on the evening of surgery. Had issues with pain control, switched to PO oxycodone with relief. They started to get up OOB with therapy on POD E0. Pt was seen during rounds and was ready to go home pending progress with therapy. He worked with therapy on POD #1 and was meeting his goals. Pt was discharged to home later that day in stable condition.  Diet: Regular diet Activity: WBAT Follow-up: in 2  weeks Disposition: Home with HEP Discharged  Condition: stable   Discharge Instructions    Call MD / Call 911   Complete by: As directed    If you experience chest pain or shortness of breath, CALL 911 and be transported to the hospital emergency room.  If you develope a fever above 101 F, pus (white drainage) or increased drainage or redness at the wound, or calf pain, call your surgeon's office.   Change dressing   Complete by: As directed    You have an adhesive waterproof bandage over the incision. Leave this in place until your first follow-up appointment. Once you remove this you will not need to place another bandage.   Constipation Prevention   Complete by: As directed    Drink plenty of fluids.  Prune juice may be helpful.  You may use a stool softener, such as Colace (over the counter) 100 mg twice a day.  Use MiraLax (over the counter) for constipation as needed.   Diet - low sodium heart healthy   Complete by: As directed    Do not sit on low chairs, stoools or toilet seats, as it may be difficult to get up from low surfaces   Complete by: As directed    Driving restrictions   Complete by: As directed    No driving for two weeks   TED hose   Complete by: As directed    Use stockings (TED hose) for three weeks on both leg(s).  You may remove them at night for sleeping.   Weight bearing as tolerated   Complete by: As directed      Allergies as of 12/08/2020   No Known Allergies     Medication List    STOP taking these medications   ibuprofen 800 MG tablet Commonly known as: ADVIL     TAKE these medications   aspirin 325 MG EC tablet Take 1 tablet (325 mg total) by mouth 2 (two) times daily for 20 days. Then take one 81 mg aspirin once a day for three weeks. Then discontinue aspirin.   buPROPion 100 MG tablet Commonly known as: WELLBUTRIN Take 100 mg by mouth daily.   gabapentin 300 MG capsule Commonly known as: NEURONTIN Take 1 capsule (300 mg total) by mouth  3 (three) times daily. Gabapentin 300 mg Protocol Take a 300 mg capsule three times a day for two weeks following surgery. Then take a 300 mg capsule two times a day for two weeks.  Then take a 300 mg capsule once a day for two weeks.  Then discontinue the Gabapentin. What changed:   when to take this  additional instructions   methocarbamol 500 MG tablet Commonly known as: ROBAXIN Take 1 tablet (500 mg total) by mouth every 6 (six) hours as needed for muscle spasms.   omeprazole 40 MG capsule Commonly known as: PRILOSEC Take 40 mg by mouth daily.   oxyCODONE 5 MG immediate release tablet Commonly known as: Oxy IR/ROXICODONE Take 1-2 tablets (5-10 mg total) by mouth every 6 (six) hours as needed for moderate pain or severe pain.   Venlafaxine HCl 150 MG Tb24 Take 150 mg by mouth daily with breakfast.   vitamin C 250 MG tablet Commonly known as: ASCORBIC ACID Take 250 mg by mouth daily.   Vitamin D-3 25 MCG (1000 UT) Caps Take 1,000 Units by mouth daily.            Discharge Care Instructions  (From admission, onward)  Start     Ordered   12/08/20 0000  Weight bearing as tolerated        12/08/20 0753   12/08/20 0000  Change dressing       Comments: You have an adhesive waterproof bandage over the incision. Leave this in place until your first follow-up appointment. Once you remove this you will not need to place another bandage.   12/08/20 0753          Follow-up Information    Nicholas GrossAluisio, Frank, MD. Schedule an appointment as soon as possible for a visit on 12/20/2020.   Specialty: Orthopedic Surgery Contact information: [address] [place] [address] [phone]               Signed: Arther AbbottKristie Paxson Harrower, PA-C Orthopedic Surgery 12/13/2020, 3:06 PM

## 2021-05-25 IMAGING — DX DG PORTABLE PELVIS
1 series · 1 of 1 positions shown · non-contrast
Comparison: None.

CLINICAL DATA: Postop hip replacement

EXAM:
PORTABLE PELVIS 1-2 VIEWS

[pelvis ap]
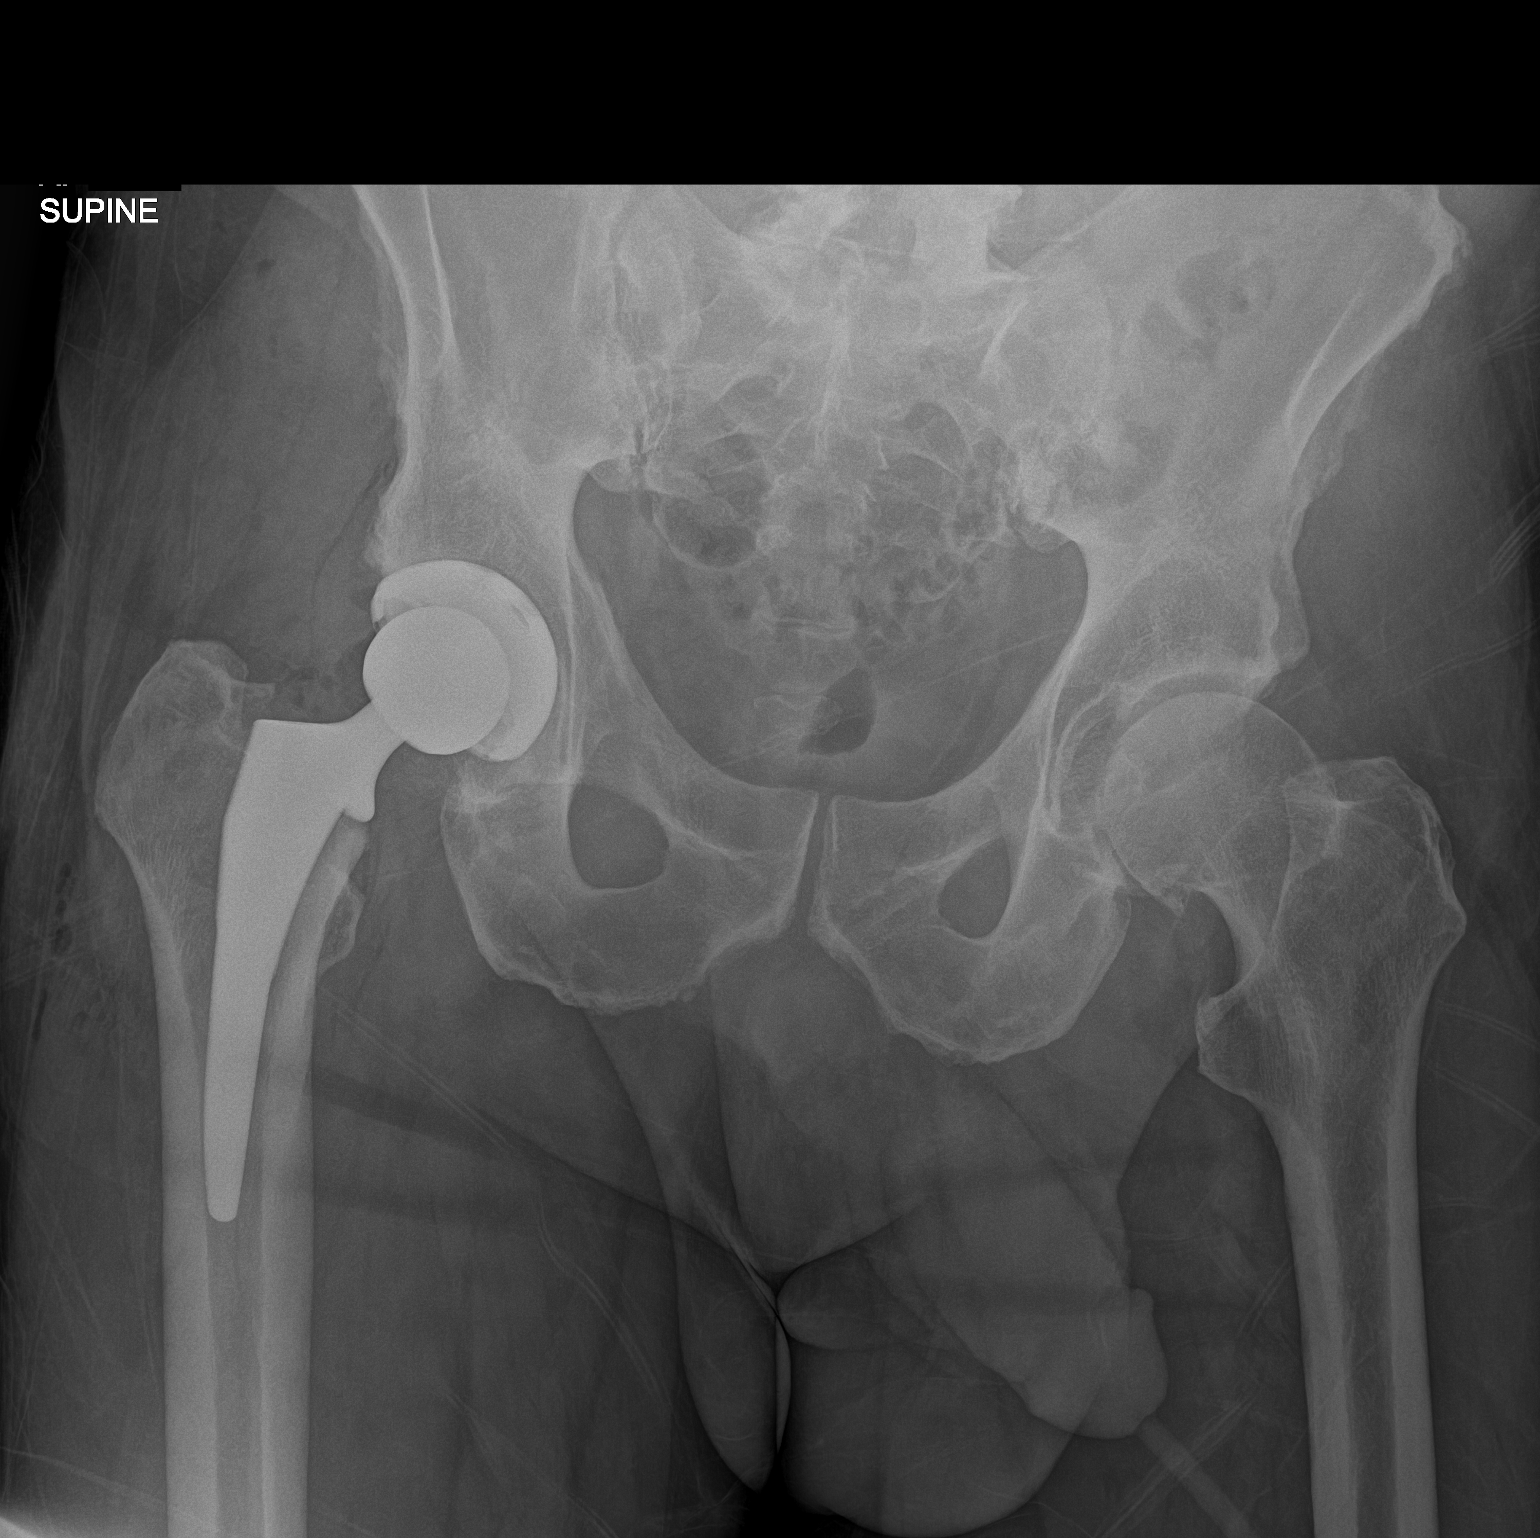

[1 of 1 positions shown; findings below may reference images not displayed]

FINDINGS: Right hip replacement in satisfactory position and alignment. No
fracture or complication. Left hip normal.
IMPRESSION: Satisfactory right hip replacement.

## 2023-02-25 NOTE — H&P (Signed)
TOTAL HIP ADMISSION H&P  Patient is admitted for left total hip arthroplasty.  Subjective:  Chief Complaint: Left hip pain  HPI: Nicholas Nash, 52 y.o. male, has a history of pain and functional disability in the left hip due to arthritis and patient has failed non-surgical conservative treatments for greater than 12 weeks to include NSAID's and/or analgesics, flexibility and strengthening excercises, and activity modification. Onset of symptoms was gradual, starting  several  years ago with gradually worsening course since that time. The patient noted no past surgery on the left hip. Patient currently rates pain in the left hip at 8 out of 10 with activity. Patient has night pain, worsening of pain with activity and weight bearing, and trendelenberg gait. Patient has evidence of  near bone-on-bone arthritis of the left hip. In fact, inferiorly, he is bone-on-bone. He has a focal area of bone-on-bone superolateral. He has marginal osteophyte formation  by imaging studies. This condition presents safety issues increasing the risk of falls. There is no current active infection.  Patient Active Problem List   Diagnosis Date Noted   OA (osteoarthritis) of hip 12/07/2020   Primary osteoarthritis of right hip 12/07/2020   OA (osteoarthritis) of knee 11/13/2017    Past Medical History:  Diagnosis Date   Anxiety    PTSD   Arthritis    Complication of anesthesia    makes him hiccup    Depression    GERD (gastroesophageal reflux disease)    History of kidney stones    Neuromuscular disorder ([place])    Sciatica   Sleep apnea    wears cpap sometimes lost 75lbs    Past Surgical History:  Procedure Laterality Date   BACK SURGERY     x2   KNEE ARTHROSCOPY Bilateral 01/2017   ROTATOR CUFF REPAIR  2000   Right    TONSILLECTOMY     TOTAL HIP ARTHROPLASTY Right 12/07/2020   Procedure: TOTAL HIP ARTHROPLASTY ANTERIOR APPROACH;  Surgeon: Gaynelle Arabian, MD;  Location: [facility];  Service:  Orthopedics;  Laterality: Right;  151min   TOTAL KNEE ARTHROPLASTY Left 11/13/2017   Procedure: LEFT TOTAL KNEE ARTHROPLASTY;  Surgeon: Gaynelle Arabian, MD;  Location: [facility];  Service: Orthopedics;  Laterality: Left;  block   TOTAL KNEE ARTHROPLASTY Right 08/11/2018   Procedure: RIGHT TOTAL KNEE ARTHROPLASTY;  Surgeon: Gaynelle Arabian, MD;  Location: [facility];  Service: Orthopedics;  Laterality: Right;    Prior to Admission medications   Medication Sig Start Date End Date Taking? Authorizing Provider  buPROPion (WELLBUTRIN) 100 MG tablet Take 100 mg by mouth daily.    [provider]  Cholecalciferol (VITAMIN D-3) 1000 units CAPS Take 1,000 Units by mouth daily.    [provider]  gabapentin (NEURONTIN) 300 MG capsule Take 1 capsule (300 mg total) by mouth 3 (three) times daily. Gabapentin 300 mg Protocol Take a 300 mg capsule three times a day for two weeks following surgery. Then take a 300 mg capsule two times a day for two weeks.  Then take a 300 mg capsule once a day for two weeks.  Then discontinue the Gabapentin. Patient taking differently: Take 300 mg by mouth at bedtime. 08/12/18   Edmisten, Ok Anis, PA  methocarbamol (ROBAXIN) 500 MG tablet Take 1 tablet (500 mg total) by mouth every 6 (six) hours as needed for muscle spasms. 12/08/20   Edmisten, Ok Anis, PA  omeprazole (PRILOSEC) 40 MG capsule Take 40 mg by mouth daily.    [provider]  oxyCODONE (OXY IR/ROXICODONE) 5 MG immediate release tablet Take 1-2 tablets (5-10 mg total) by mouth every 6 (six) hours as needed for moderate pain or severe pain. 12/08/20   Edmisten, Ok Anis, PA  Venlafaxine HCl 150 MG TB24 Take 150 mg by mouth daily with breakfast.    [provider]  vitamin C (ASCORBIC ACID) 250 MG tablet Take 250 mg by mouth daily.    [provider]    No Known Allergies  Social History   Socioeconomic History   Marital status: Married    Spouse name: Not on file   Number  of children: Not on file   Years of education: Not on file   Highest education level: Not on file  Occupational History   Not on file  Tobacco Use   Smoking status: Never   Smokeless tobacco: Never  Vaping Use   Vaping Use: Never used  Substance and Sexual Activity   Alcohol use: No   Drug use: No   Sexual activity: Yes    Birth control/protection: None  Other Topics Concern   Not on file  Social History Narrative   Not on file   Social Determinants of Health   Financial Resource Strain: Not on file  Food Insecurity: Not on file  Transportation Needs: Not on file  Physical Activity: Not on file  Stress: Not on file  Social Connections: Not on file  Intimate Partner Violence: Not on file    Tobacco Use: Low Risk  (12/08/2020)   Patient History    Smoking Tobacco Use: Never    Smokeless Tobacco Use: Never    Passive Exposure: Not on file   Social History   Substance and Sexual Activity  Alcohol Use No    No family history on file.  Review of Systems  Constitutional:  Negative for chills and fever.  HENT: Negative.    Eyes: Negative.   Respiratory:  Negative for cough and shortness of breath.   Cardiovascular:  Negative for chest pain and palpitations.  Gastrointestinal:  Negative for abdominal pain, constipation, diarrhea, nausea and vomiting.  Genitourinary:  Negative for dysuria, frequency and urgency.  Musculoskeletal:  Positive for joint pain.  Skin:  Negative for rash.   Objective:  Physical Exam: Well nourished and well developed.  General: Alert and oriented x3, cooperative and pleasant, no acute distress.  Head: normocephalic, atraumatic, neck supple.  Eyes: EOMI. Abdomen: non-tender to palpation and soft, normoactive bowel sounds. Musculoskeletal: The patient has an antalgic gait pattern favoring the left side without the use of assistive devices.   Left Hip Exam:  Range of motion: Flexion to 100 degrees, internal rotation is minimal, external  rotation to 20 degrees, and abduction to 20 degrees without discomfort.  There is no tenderness over the greater trochanteric bursa.  Calves soft and nontender. Motor function intact in LE. Strength 5/5 LE bilaterally. Neuro: Distal pulses 2+. Sensation to light touch intact in LE.  Vital signs in last 24 hours: BP: ()/()  Arterial Line BP: ()/()   Imaging Review Plain radiographs demonstrate severe degenerative joint disease of the left hip. The bone quality appears to be adequate for age and reported activity level.  Assessment/Plan:  End stage arthritis, left hip  The patient history, physical examination, clinical judgement of the provider and imaging studies are consistent with end stage degenerative joint disease of the left hip and total hip arthroplasty is deemed medically necessary. The treatment options including medical management, injection therapy,  arthroscopy and arthroplasty were discussed at length. The risks and benefits of total hip arthroplasty were presented and reviewed. The risks due to aseptic loosening, infection, stiffness, dislocation/subluxation, thromboembolic complications and other imponderables were discussed. The patient acknowledged the explanation, agreed to proceed with the plan and consent was signed. Patient is being admitted for inpatient treatment for surgery, pain control, PT, OT, prophylactic antibiotics, VTE prophylaxis, progressive ambulation and ADLs and discharge planning.The patient is planning to be discharged  home .  Therapy Plans: HEP Disposition: Home with Wife Planned DVT Prophylaxis: Aspirin 81 mg BID DME Needed: None PCP: Mikki Harbor, MD (clearance received) TXA: IV Allergies: NKDA Anesthesia Concerns: hiccups BMI: 30.1 Last HgbA1c: not diabetic  Pharmacy: [facility] ([address], [place])  Other: - History of hiccups following anesthesia with previous TKAs  - Patient was instructed on what medications to stop prior  to surgery. - Follow-up visit in 2 weeks with Dr. Wynelle Link - Begin physical therapy following surgery - Pre-operative lab work as pre-surgical testing - Prescriptions will be provided in hospital at time of discharge  R. Jaynie Bream, PA-C Orthopedic Surgery [facility] [place]

## 2023-03-11 NOTE — Progress Notes (Signed)
Anesthesia Review:  PCP: DR Jamey Reas- 02/07/23- clearance on chart LOV 01/09/23 on chart  Cardiologist : Chest x-ray : EKG : Echo : Stress test: Cardiac Cath :  Activity level:  Sleep Study/ CPAP : Fasting Blood Sugar :      / Checks Blood Sugar -- times a day:   Blood Thinner/ Instructions /Last Dose: ASA / Instructions/ Last Dose :

## 2023-03-12 ENCOUNTER — Encounter (HOSPITAL_COMMUNITY)
Admission: RE | Admit: 2023-03-12 | Discharge: 2023-03-12 | Disposition: A | Discharge status: Home / Self Care | Payer: No Typology Code available for payment source | Source: Ambulatory Visit | Attending: General Practice | Admitting: General Practice

## 2023-03-14 NOTE — Progress Notes (Addendum)
COVID Vaccine received:  []  No [x]  Yes Date of any COVID positive Test in last 90 days:  None  PCP - Rodena Goldmann, MD   Clearance sent to Aluisio's office  Cardiologist -   Chest x-ray -  EKG -  no hx to warrant Stress Test -  ECHO -  Cardiac Cath -   PCR screen: [x]  Ordered & Completed           []   No Order but Needs PROFEND           []   N/A for this surgery  Surgery Plan:  []  Ambulatory                            [x]  Outpatient in bed                            []  Admit  Anesthesia:    []  General  []  Spinal                           [x]   Choice []   MAC  Pacemaker / ICD device [x]  No []  Yes   Spinal Cord Stimulator:[x]  No []  Yes       History of Sleep Apnea? []  No [x]  Yes   CPAP used?- [x]  No []  Yes  lost weight  Does the patient monitor blood sugar?          []  No []  Yes  [x]  N/A  Patient has: [x]  NO Hx DM   []  Pre-DM                 []  DM1  []   DM2  Blood Thinner / Instructions:  none Aspirin Instructions:  none  ERAS Protocol Ordered: []  No  [x]  Yes PRE-SURGERY [x]  ENSURE  []  G2  Patient is to be NPO after: 11:00 am  Comments: Patient was given the 5 CHG shower / bath instructions for THA surgery along with 2 bottles of the CHG soap. Patient will start this on:   03-21-23     Patient voiced understanding of this process.   Activity level: Patient is able to climb a flight of stairs without difficulty; [x]  No CP  [x]  No SOB, but would have leg pain. Patient can perform ADLs without assistance.   Anesthesia review: OSA-No CPAP, PTSD (veteran of [place]), GERD, ADHD  Patient denies shortness of breath, fever, cough and chest pain at PAT appointment.  Patient verbalized understanding and agreement to the Pre-Surgical Instructions that were given to them at this PAT appointment. Patient was also educated of the need to review these PAT instructions again prior to his surgery.I reviewed the appropriate phone numbers to call if they have any and questions or  concerns.

## 2023-03-14 NOTE — Patient Instructions (Addendum)
SURGICAL WAITING ROOM VISITATION Patients having surgery or a procedure may have no more than 2 support people in the waiting area - these visitors may rotate in the visitor waiting room.   Due to an increase in RSV and influenza rates and associated hospitalizations, children ages 63 and under may not visit patients in [facility] hospitals. If the patient needs to stay at the hospital during part of their recovery, the visitor guidelines for inpatient rooms apply.  PRE-OP VISITATION  Pre-op nurse will coordinate an appropriate time for 1 support person to accompany the patient in pre-op.  This support person may not rotate.  This visitor will be contacted when the time is appropriate for the visitor to come back in the pre-op area.  Please refer to the [facility] website for the visitor guidelines for Inpatients (after your surgery is over and you are in a regular room).  You are not required to quarantine at this time prior to your surgery. However, you must do this: Hand Hygiene often Do NOT share personal items Notify your provider if you are in close contact with someone who has COVID or you develop fever 100.4 or greater, new onset of sneezing, cough, sore throat, shortness of breath or body aches.  If you test positive for Covid or have been in contact with anyone that has tested positive in the last 10 days please notify you surgeon.    Your procedure is scheduled on:  Monday  March 25, 2023  Report to [facility] Main Entrance: Mobile entrance where the [employer] is available.   Report to admitting at: 11:30    AM  +++++Call this number if you have any questions or problems the morning of surgery [phone]  Do not eat food after Midnight the night prior to your surgery/procedure.  After Midnight you may have the following liquids until 11:00 AM DAY OF SURGERY  Clear Liquid Diet Water Black Coffee (sugar ok, NO MILK/CREAM OR CREAMERS)  Tea (sugar ok, NO  MILK/CREAM OR CREAMERS) regular and decaf                             Plain Jell-O  with no fruit (NO RED)                                           Fruit ices (not with fruit pulp, NO RED)                                     Popsicles (NO RED)                                                                  Juice: apple, WHITE grape, WHITE cranberry Sports drinks like Gatorade or Powerade (NO RED)                   The day of surgery:  Drink ONE (1) Pre-Surgery Clear Ensure at  11:00 AM the morning of surgery. Drink in one sitting. Do not  sip.  This drink was given to you during your hospital pre-op appointment visit. Nothing else to drink after completing the Pre-Surgery Clear Ensure : No candy, chewing gum or throat lozenges.    FOLLOW ANY ADDITIONAL PRE OP INSTRUCTIONS YOU RECEIVED FROM YOUR SURGEON'S OFFICE!!!   Oral Hygiene is also important to reduce your risk of infection.        Remember - BRUSH YOUR TEETH THE MORNING OF SURGERY WITH YOUR REGULAR TOOTHPASTE  Do NOT smoke after Midnight the night before surgery.  Take ONLY these medicines the morning of surgery with A SIP OF WATER: omeprazole, venlafaxine, bupropion   You may not have any metal on your body including  jewelry, and body piercing  Do not wear lotions, powders, cologne, or deodorant  Men may shave face and neck.  Contacts, Hearing Aids, dentures or bridgework may not be worn into surgery. DENTURES WILL BE REMOVED PRIOR TO SURGERY PLEASE DO NOT APPLY "Poly grip" OR ADHESIVES!!!  You may bring a small overnight bag with you on the day of surgery, only pack items that are not valuable. [facility] IS NOT RESPONSIBLE   FOR VALUABLES THAT ARE LOST OR STOLEN.   Do not bring your home medications to the hospital. The Pharmacy will dispense medications listed on your medication list to you during your admission in the Hospital.  Special Instructions: Bring a copy of your healthcare power of attorney and living will  documents the day of surgery, if you wish to have them scanned into your [facility]- EPIC  Please read over the following fact sheets you were given: IF YOU HAVE QUESTIONS ABOUT YOUR PRE-OP INSTRUCTIONS, PLEASE CALL [phone].   +++++++ PLEASE FOLLOW THE ATTACHED INFORMATION REGARDING SHOWERING / BATHING SCHEDULE  PRIOR TO YOUR SURGERY. Start this schedule on :  Thursday March 21, 2023    FAILURE TO FOLLOW THESE INSTRUCTIONS MAY RESULT IN THE CANCELLATION OF YOUR SURGERY  PATIENT SIGNATURE_________________________________  NURSE SIGNATURE__________________________________  ________________________________________________________________________        Rogelia MireIncentive Spirometer    An incentive spirometer is a tool that can help keep your lungs clear and active. This tool measures how well you are filling your lungs with each breath. Taking long deep breaths may help reverse or decrease the chance of developing breathing (pulmonary) problems (especially infection) following: A long period of time when you are unable to move or be active. BEFORE THE PROCEDURE  If the spirometer includes an indicator to show your best effort, your nurse or respiratory therapist will set it to a desired goal. If possible, sit up straight or lean slightly forward. Try not to slouch. Hold the incentive spirometer in an upright position. INSTRUCTIONS FOR USE  Sit on the edge of your bed if possible, or sit up as far as you can in bed or on a chair. Hold the incentive spirometer in an upright position. Breathe out normally. Place the mouthpiece in your mouth and seal your lips tightly around it. Breathe in slowly and as deeply as possible, raising the piston or the ball toward the top of the column. Hold your breath for 3-5 seconds or for as long as possible. Allow the piston or ball to fall to the bottom of the column. Remove the mouthpiece from your mouth and breathe out normally. Rest  for a few seconds and repeat Steps 1 through 7 at least 10 times every 1-2 hours when you are awake. Take your time and take a few normal breaths  between deep breaths. The spirometer may include an indicator to show your best effort. Use the indicator as a goal to work toward during each repetition. After each set of 10 deep breaths, practice coughing to be sure your lungs are clear. If you have an incision (the cut made at the time of surgery), support your incision when coughing by placing a pillow or rolled up towels firmly against it. Once you are able to get out of bed, walk around indoors and cough well. You may stop using the incentive spirometer when instructed by your caregiver.  RISKS AND COMPLICATIONS Take your time so you do not get dizzy or light-headed. If you are in pain, you may need to take or ask for pain medication before doing incentive spirometry. It is harder to take a deep breath if you are having pain. AFTER USE Rest and breathe slowly and easily. It can be helpful to keep track of a log of your progress. Your caregiver can provide you with a simple table to help with this. If you are using the spirometer at home, follow these instructions: SEEK MEDICAL CARE IF:  You are having difficultly using the spirometer. You have trouble using the spirometer as often as instructed. Your pain medication is not giving enough relief while using the spirometer. You develop fever of 100.5 F (38.1 C) or higher.                                                                                                    SEEK IMMEDIATE MEDICAL CARE IF:  You cough up bloody sputum that had not been present before. You develop fever of 102 F (38.9 C) or greater. You develop worsening pain at or near the incision site. MAKE SURE YOU:  Understand these instructions. Will watch your condition. Will get help right away if you are not doing well or get worse. Document Released: 04/01/2007 Document  Revised: 02/11/2012 Document Reviewed: 06/02/2007 [facility] Patient Information 2014 [place], [place].    WHAT IS A BLOOD TRANSFUSION? Blood Transfusion Information  A transfusion is the replacement of blood or some of its parts. Blood is made up of multiple cells which provide different functions. Red blood cells carry oxygen and are used for blood loss replacement. White blood cells fight against infection. Platelets control bleeding. Plasma helps clot blood. Other blood products are available for specialized needs, such as hemophilia or other clotting disorders. BEFORE THE TRANSFUSION  Who gives blood for transfusions?  Healthy volunteers who are fully evaluated to make sure their blood is safe. This is blood bank blood. Transfusion therapy is the safest it has ever been in the practice of medicine. Before blood is taken from a donor, a complete history is taken to make sure that person has no history of diseases nor engages in risky social behavior (examples are intravenous drug use or sexual activity with multiple partners). The donor's travel history is screened to minimize risk of transmitting infections, such as malaria. The donated blood is tested for signs of infectious diseases, such as HIV and hepatitis. The blood is  then tested to be sure it is compatible with you in order to minimize the chance of a transfusion reaction. If you or a relative donates blood, this is often done in anticipation of surgery and is not appropriate for emergency situations. It takes many days to process the donated blood. RISKS AND COMPLICATIONS Although transfusion therapy is very safe and saves many lives, the main dangers of transfusion include:  Getting an infectious disease. Developing a transfusion reaction. This is an allergic reaction to something in the blood you were given. Every precaution is taken to prevent this. The decision to have a blood transfusion has been considered carefully by your  caregiver before blood is given. Blood is not given unless the benefits outweigh the risks. AFTER THE TRANSFUSION Right after receiving a blood transfusion, you will usually feel much better and more energetic. This is especially true if your red blood cells have gotten low (anemic). The transfusion raises the level of the red blood cells which carry oxygen, and this usually causes an energy increase. The nurse administering the transfusion will monitor you carefully for complications. HOME CARE INSTRUCTIONS  No special instructions are needed after a transfusion. You may find your energy is better. Speak with your caregiver about any limitations on activity for underlying diseases you may have. SEEK MEDICAL CARE IF:  Your condition is not improving after your transfusion. You develop redness or irritation at the intravenous (IV) site. SEEK IMMEDIATE MEDICAL CARE IF:  Any of the following symptoms occur over the next 12 hours: Shaking chills. You have a temperature by mouth above 102 F (38.9 C), not controlled by medicine. Chest, back, or muscle pain. People around you feel you are not acting correctly or are confused. Shortness of breath or difficulty breathing. Dizziness and fainting. You get a rash or develop hives. You have a decrease in urine output. Your urine turns a dark color or changes to pink, red, or brown. Any of the following symptoms occur over the next 10 days: You have a temperature by mouth above 102 F (38.9 C), not controlled by medicine. Shortness of breath. Weakness after normal activity. The white part of the eye turns yellow (jaundice). You have a decrease in the amount of urine or are urinating less often. Your urine turns a dark color or changes to pink, red, or brown. Document Released: 11/16/2000 Document Revised: 02/11/2012 Document Reviewed: 07/05/2008 [facility] Patient Information 2014 [place],  [place].  _______________________________________________________________________

## 2023-03-15 ENCOUNTER — Encounter (HOSPITAL_COMMUNITY)
Admission: RE | Admit: 2023-03-15 | Discharge: 2023-03-15 | Disposition: A | Discharge status: Home / Self Care | Payer: No Typology Code available for payment source | Source: Ambulatory Visit | Attending: Anesthesiology | Admitting: Anesthesiology

## 2023-03-15 VITALS — BP 130/88 | HR 72 | Temp 98.0°F | Resp 16 | Ht 76.0 in | Wt 240.0 lb

## 2023-03-15 DIAGNOSIS — Z01818 Encounter for other preprocedural examination: Secondary | ICD-10-CM

## 2023-03-15 DIAGNOSIS — N289 Disorder of kidney and ureter, unspecified: Secondary | ICD-10-CM

## 2023-03-15 DIAGNOSIS — M1611 Unilateral primary osteoarthritis, right hip: Secondary | ICD-10-CM

## 2023-03-15 HISTORY — DX: Pneumonia, unspecified organism: J18.9

## 2023-03-15 HISTORY — DX: Respiratory tuberculosis unspecified: A15.9

## 2023-03-19 ENCOUNTER — Other Ambulatory Visit: Payer: Self-pay

## 2023-03-19 ENCOUNTER — Encounter (HOSPITAL_COMMUNITY): Payer: Self-pay

## 2023-03-19 ENCOUNTER — Encounter (HOSPITAL_COMMUNITY)
Admission: RE | Admit: 2023-03-19 | Discharge: 2023-03-19 | Disposition: A | Discharge status: Home / Self Care | Payer: No Typology Code available for payment source | Source: Ambulatory Visit | Attending: Orthopedic Surgery | Admitting: Orthopedic Surgery

## 2023-03-19 DIAGNOSIS — Z01812 Encounter for preprocedural laboratory examination: Secondary | ICD-10-CM | POA: Diagnosis present

## 2023-03-19 DIAGNOSIS — N289 Disorder of kidney and ureter, unspecified: Secondary | ICD-10-CM

## 2023-03-19 DIAGNOSIS — Z01818 Encounter for other preprocedural examination: Secondary | ICD-10-CM

## 2023-03-19 DIAGNOSIS — M1611 Unilateral primary osteoarthritis, right hip: Secondary | ICD-10-CM | POA: Insufficient documentation

## 2023-03-19 LAB — BASIC METABOLIC PANEL
Anion gap: 7 (ref 5–15)
BUN: 24 mg/dL — ABNORMAL HIGH (ref 6–20)
CO2: 25 mmol/L (ref 22–32)
Calcium: 9.2 mg/dL (ref 8.9–10.3)
Chloride: 107 mmol/L (ref 98–111)
Creatinine, Ser: 1.28 mg/dL — ABNORMAL HIGH (ref 0.61–1.24)
GFR, Estimated: >60 mL/min (ref >60)
Glucose, Bld: 90 mg/dL (ref 70–99)
Potassium: 3.7 mmol/L (ref 3.5–5.1)
Sodium: 139 mmol/L (ref 135–145)

## 2023-03-19 LAB — CBC
HCT: 44.1 % (ref 39.0–52.0)
Hemoglobin: 14.7 g/dL (ref 13.0–17.0)
MCH: 30.7 pg (ref 26.0–34.0)
MCHC: 33.3 g/dL (ref 30.0–36.0)
MCV: 92.1 fL (ref 80.0–100.0)
Platelets: 297 10*3/uL (ref 150–400)
RBC: 4.79 MIL/uL (ref 4.22–5.81)
RDW: 12.4 % (ref 11.5–15.5)
WBC: 5.9 10*3/uL (ref 4.0–10.5)
nRBC: 0 % (ref 0.0–0.2)

## 2023-03-19 LAB — SURGICAL PCR SCREEN
MRSA, PCR: NEGATIVE
Staphylococcus aureus: POSITIVE — AB

## 2023-03-20 NOTE — Progress Notes (Signed)
Patient's PCR screen is positive for [facility]. Appropriate notes have been placed on the patient's chart. This note has been routed to Dr. Lequita Halt for review. The Patient's surgery is currently scheduled for: 03-25-23 at [facility].  Rudean Haskell, BSN, CVRN-BC   Pre-Surgical Testing Nurse [facility]  [phone]

## 2023-03-25 ENCOUNTER — Other Ambulatory Visit: Payer: Self-pay

## 2023-03-25 ENCOUNTER — Ambulatory Visit (HOSPITAL_COMMUNITY): Payer: No Typology Code available for payment source | Admitting: Certified Registered Nurse Anesthetist

## 2023-03-25 ENCOUNTER — Observation Stay (HOSPITAL_COMMUNITY)
Admission: RE | Admit: 2023-03-25 | Discharge: 2023-03-26 | Disposition: A | Discharge status: Home / Self Care | Payer: No Typology Code available for payment source | Attending: Orthopedic Surgery | Admitting: Orthopedic Surgery

## 2023-03-25 ENCOUNTER — Encounter (HOSPITAL_COMMUNITY)
Admission: RE | Disposition: A | Discharge status: Home / Self Care | Payer: Self-pay | Source: Home / Self Care | Attending: Orthopedic Surgery

## 2023-03-25 ENCOUNTER — Encounter (HOSPITAL_COMMUNITY): Payer: Self-pay | Admitting: Orthopedic Surgery

## 2023-03-25 ENCOUNTER — Ambulatory Visit (HOSPITAL_COMMUNITY): Payer: No Typology Code available for payment source

## 2023-03-25 ENCOUNTER — Observation Stay (HOSPITAL_COMMUNITY): Payer: No Typology Code available for payment source

## 2023-03-25 DIAGNOSIS — Z96653 Presence of artificial knee joint, bilateral: Secondary | ICD-10-CM | POA: Insufficient documentation

## 2023-03-25 DIAGNOSIS — M1612 Unilateral primary osteoarthritis, left hip: Principal | ICD-10-CM | POA: Diagnosis present

## 2023-03-25 DIAGNOSIS — Z96641 Presence of right artificial hip joint: Secondary | ICD-10-CM | POA: Insufficient documentation

## 2023-03-25 DIAGNOSIS — G4730 Sleep apnea, unspecified: Secondary | ICD-10-CM

## 2023-03-25 DIAGNOSIS — F418 Other specified anxiety disorders: Secondary | ICD-10-CM

## 2023-03-25 DIAGNOSIS — G709 Myoneural disorder, unspecified: Secondary | ICD-10-CM | POA: Diagnosis not present

## 2023-03-25 DIAGNOSIS — M169 Osteoarthritis of hip, unspecified: Secondary | ICD-10-CM | POA: Diagnosis present

## 2023-03-25 HISTORY — PX: TOTAL HIP ARTHROPLASTY: SHX124

## 2023-03-25 LAB — TYPE AND SCREEN
ABO/RH(D): A NEG
Antibody Screen: NEGATIVE

## 2023-03-25 SURGERY — ARTHROPLASTY, HIP, TOTAL, ANTERIOR APPROACH
Anesthesia: Spinal | Site: Hip | Laterality: Left

## 2023-03-25 MED ORDER — PROPOFOL 500 MG/50ML IV EMUL
INTRAVENOUS | Status: AC
  Filled 2023-03-25: qty 50

## 2023-03-25 MED ORDER — 0.9 % SODIUM CHLORIDE (POUR BTL) OPTIME
TOPICAL | Status: DC | PRN
  Administered 2023-03-25: 1000 mL

## 2023-03-25 MED ORDER — METHOCARBAMOL 500 MG PO TABS
500.0000 mg | ORAL_TABLET | Freq: Four times a day (QID) | ORAL | Status: DC | PRN
  Administered 2023-03-25 – 2023-03-26 (×3): 500 mg via ORAL
  Filled 2023-03-25 (×3): qty 1

## 2023-03-25 MED ORDER — HYDROCODONE-ACETAMINOPHEN 5-325 MG PO TABS
1.0000 | ORAL_TABLET | ORAL | Status: DC | PRN
  Filled 2023-03-25: qty 2

## 2023-03-25 MED ORDER — ACETAMINOPHEN 500 MG PO TABS: 1000.0000 mg | ORAL_TABLET | Freq: Once | ORAL | Status: DC | PRN

## 2023-03-25 MED ORDER — ASPIRIN 81 MG PO CHEW
81.0000 mg | CHEWABLE_TABLET | Freq: Two times a day (BID) | ORAL | Status: DC
  Administered 2023-03-26: 81 mg via ORAL
  Filled 2023-03-25: qty 1

## 2023-03-25 MED ORDER — POLYETHYLENE GLYCOL 3350 17 G PO PACK: 17.0000 g | PACK | Freq: Every day | ORAL | Status: DC | PRN

## 2023-03-25 MED ORDER — PHENYLEPHRINE 80 MCG/ML (10ML) SYRINGE FOR IV PUSH (FOR BLOOD PRESSURE SUPPORT)
PREFILLED_SYRINGE | INTRAVENOUS | Status: DC | PRN
  Administered 2023-03-25: 160 ug via INTRAVENOUS

## 2023-03-25 MED ORDER — ONDANSETRON HCL 4 MG/2ML IJ SOLN
INTRAMUSCULAR | Status: DC | PRN
  Administered 2023-03-25: 4 mg via INTRAVENOUS

## 2023-03-25 MED ORDER — DEXMEDETOMIDINE HCL IN NACL 80 MCG/20ML IV SOLN
INTRAVENOUS | Status: DC | PRN
  Administered 2023-03-25: 12 ug via BUCCAL

## 2023-03-25 MED ORDER — TRANEXAMIC ACID-NACL 1000-0.7 MG/100ML-% IV SOLN
1000.0000 mg | INTRAVENOUS | Status: AC
  Administered 2023-03-25: 1000 mg via INTRAVENOUS
  Filled 2023-03-25: qty 100

## 2023-03-25 MED ORDER — LACTATED RINGERS IV SOLN: INTRAVENOUS | Status: DC

## 2023-03-25 MED ORDER — FENTANYL CITRATE (PF) 100 MCG/2ML IJ SOLN
INTRAMUSCULAR | Status: DC | PRN
  Administered 2023-03-25 (×2): 50 ug via INTRAVENOUS

## 2023-03-25 MED ORDER — METHOCARBAMOL 1000 MG/10ML IJ SOLN: 500.0000 mg | Freq: Four times a day (QID) | INTRAVENOUS | Status: DC | PRN

## 2023-03-25 MED ORDER — METOCLOPRAMIDE HCL 5 MG PO TABS: 5.0000 mg | ORAL_TABLET | Freq: Three times a day (TID) | ORAL | Status: DC | PRN

## 2023-03-25 MED ORDER — BUPROPION HCL 100 MG PO TABS
100.0000 mg | ORAL_TABLET | Freq: Every day | ORAL | Status: DC
  Administered 2023-03-26: 100 mg via ORAL
  Filled 2023-03-25: qty 1

## 2023-03-25 MED ORDER — BISACODYL 10 MG RE SUPP: 10.0000 mg | Freq: Every day | RECTAL | Status: DC | PRN

## 2023-03-25 MED ORDER — BUPIVACAINE HCL (PF) 0.25 % IJ SOLN
INTRAMUSCULAR | Status: AC
  Filled 2023-03-25: qty 30

## 2023-03-25 MED ORDER — PHENYLEPHRINE HCL (PRESSORS) 10 MG/ML IV SOLN
INTRAVENOUS | Status: AC
  Filled 2023-03-25: qty 1

## 2023-03-25 MED ORDER — MORPHINE SULFATE (PF) 2 MG/ML IV SOLN
0.5000 mg | INTRAVENOUS | Status: DC | PRN
  Administered 2023-03-25 – 2023-03-26 (×4): 1 mg via INTRAVENOUS
  Filled 2023-03-25 (×4): qty 1

## 2023-03-25 MED ORDER — DEXAMETHASONE SODIUM PHOSPHATE 10 MG/ML IJ SOLN: 8.0000 mg | Freq: Once | INTRAMUSCULAR | Status: DC

## 2023-03-25 MED ORDER — CEFAZOLIN SODIUM-DEXTROSE 2-4 GM/100ML-% IV SOLN
2.0000 g | INTRAVENOUS | Status: AC
Start: 2023-03-25 — End: 2023-03-25
  Administered 2023-03-25: 2 g via INTRAVENOUS
  Filled 2023-03-25: qty 100

## 2023-03-25 MED ORDER — FENTANYL CITRATE PF 50 MCG/ML IJ SOSY: 25.0000 ug | PREFILLED_SYRINGE | INTRAMUSCULAR | Status: DC | PRN

## 2023-03-25 MED ORDER — METOCLOPRAMIDE HCL 5 MG/ML IJ SOLN: 5.0000 mg | Freq: Three times a day (TID) | INTRAMUSCULAR | Status: DC | PRN

## 2023-03-25 MED ORDER — ACETAMINOPHEN 160 MG/5ML PO SOLN: 1000.0000 mg | Freq: Once | ORAL | Status: DC | PRN

## 2023-03-25 MED ORDER — SODIUM CHLORIDE 0.9 % IV SOLN: INTRAVENOUS | Status: DC

## 2023-03-25 MED ORDER — MIDAZOLAM HCL 2 MG/2ML IJ SOLN
INTRAMUSCULAR | Status: AC
  Filled 2023-03-25: qty 2

## 2023-03-25 MED ORDER — ACETAMINOPHEN 10 MG/ML IV SOLN
1000.0000 mg | Freq: Four times a day (QID) | INTRAVENOUS | Status: DC
Start: 2023-03-25 — End: 2023-03-25
  Administered 2023-03-25: 1000 mg via INTRAVENOUS
  Filled 2023-03-25: qty 100

## 2023-03-25 MED ORDER — HYDROCODONE-ACETAMINOPHEN 7.5-325 MG PO TABS
1.0000 | ORAL_TABLET | ORAL | Status: DC | PRN
  Administered 2023-03-25 – 2023-03-26 (×4): 2 via ORAL
  Filled 2023-03-25 (×4): qty 2

## 2023-03-25 MED ORDER — MENTHOL 3 MG MT LOZG: 1.0000 | LOZENGE | OROMUCOSAL | Status: DC | PRN

## 2023-03-25 MED ORDER — PHENYLEPHRINE HCL-NACL 20-0.9 MG/250ML-% IV SOLN
INTRAVENOUS | Status: DC | PRN
  Administered 2023-03-25: 40 ug/min via INTRAVENOUS

## 2023-03-25 MED ORDER — PHENOL 1.4 % MT LIQD: 1.0000 | OROMUCOSAL | Status: DC | PRN

## 2023-03-25 MED ORDER — ORAL CARE MOUTH RINSE: 15.0000 mL | Freq: Once | OROMUCOSAL | Status: AC

## 2023-03-25 MED ORDER — BUPIVACAINE IN DEXTROSE 0.75-8.25 % IT SOLN
INTRATHECAL | Status: DC | PRN
  Administered 2023-03-25: 2 mL via INTRATHECAL

## 2023-03-25 MED ORDER — POVIDONE-IODINE 10 % EX SWAB: 2.0000 | Freq: Once | CUTANEOUS | Status: DC

## 2023-03-25 MED ORDER — BUPIVACAINE HCL 0.25 % IJ SOLN
INTRAMUSCULAR | Status: DC | PRN
  Administered 2023-03-25: 30 mL

## 2023-03-25 MED ORDER — WATER FOR IRRIGATION, STERILE IR SOLN
Status: DC | PRN
  Administered 2023-03-25: 2000 mL

## 2023-03-25 MED ORDER — ONDANSETRON HCL 4 MG PO TABS: 4.0000 mg | ORAL_TABLET | Freq: Four times a day (QID) | ORAL | Status: DC | PRN

## 2023-03-25 MED ORDER — VENLAFAXINE HCL ER 150 MG PO CP24
150.0000 mg | ORAL_CAPSULE | Freq: Every day | ORAL | Status: DC
  Administered 2023-03-26: 150 mg via ORAL
  Filled 2023-03-25: qty 1

## 2023-03-25 MED ORDER — PANTOPRAZOLE SODIUM 40 MG PO TBEC
40.0000 mg | DELAYED_RELEASE_TABLET | Freq: Every day | ORAL | Status: DC
  Administered 2023-03-26: 40 mg via ORAL
  Filled 2023-03-25: qty 1

## 2023-03-25 MED ORDER — MAGNESIUM CITRATE PO SOLN: 1.0000 | Freq: Once | ORAL | Status: DC | PRN

## 2023-03-25 MED ORDER — GLYCOPYRROLATE 0.2 MG/ML IJ SOLN
INTRAMUSCULAR | Status: DC | PRN
  Administered 2023-03-25: .1 mg via INTRAVENOUS

## 2023-03-25 MED ORDER — LIDOCAINE 2% (20 MG/ML) 5 ML SYRINGE
INTRAMUSCULAR | Status: DC | PRN
  Administered 2023-03-25: 60 mg via INTRAVENOUS

## 2023-03-25 MED ORDER — DEXAMETHASONE SODIUM PHOSPHATE 10 MG/ML IJ SOLN
10.0000 mg | Freq: Once | INTRAMUSCULAR | Status: AC
  Administered 2023-03-26: 10 mg via INTRAVENOUS
  Filled 2023-03-25: qty 1

## 2023-03-25 MED ORDER — CHLORHEXIDINE GLUCONATE 0.12 % MT SOLN
15.0000 mL | Freq: Once | OROMUCOSAL | Status: AC
  Administered 2023-03-25: 15 mL via OROMUCOSAL

## 2023-03-25 MED ORDER — OXYCODONE HCL 5 MG/5ML PO SOLN: 5.0000 mg | Freq: Once | ORAL | Status: DC | PRN

## 2023-03-25 MED ORDER — OXYCODONE HCL 5 MG PO TABS: 5.0000 mg | ORAL_TABLET | Freq: Once | ORAL | Status: DC | PRN

## 2023-03-25 MED ORDER — DOCUSATE SODIUM 100 MG PO CAPS
100.0000 mg | ORAL_CAPSULE | Freq: Two times a day (BID) | ORAL | Status: DC
  Administered 2023-03-25 – 2023-03-26 (×2): 100 mg via ORAL
  Filled 2023-03-25 (×2): qty 1

## 2023-03-25 MED ORDER — FENTANYL CITRATE (PF) 100 MCG/2ML IJ SOLN
INTRAMUSCULAR | Status: AC
  Filled 2023-03-25: qty 2

## 2023-03-25 MED ORDER — ONDANSETRON HCL 4 MG/2ML IJ SOLN: 4.0000 mg | Freq: Four times a day (QID) | INTRAMUSCULAR | Status: DC | PRN

## 2023-03-25 MED ORDER — CEFAZOLIN SODIUM-DEXTROSE 2-4 GM/100ML-% IV SOLN
2.0000 g | Freq: Four times a day (QID) | INTRAVENOUS | Status: AC
  Administered 2023-03-25 – 2023-03-26 (×2): 2 g via INTRAVENOUS
  Filled 2023-03-25 (×2): qty 100

## 2023-03-25 MED ORDER — ACETAMINOPHEN 325 MG PO TABS: 325.0000 mg | ORAL_TABLET | Freq: Four times a day (QID) | ORAL | Status: DC | PRN

## 2023-03-25 MED ORDER — ACETAMINOPHEN 10 MG/ML IV SOLN: 1000.0000 mg | Freq: Once | INTRAVENOUS | Status: DC | PRN

## 2023-03-25 MED ORDER — MIDAZOLAM HCL 2 MG/2ML IJ SOLN
INTRAMUSCULAR | Status: DC | PRN
  Administered 2023-03-25: 2 mg via INTRAVENOUS

## 2023-03-25 MED ORDER — PROPOFOL 500 MG/50ML IV EMUL
INTRAVENOUS | Status: DC | PRN
  Administered 2023-03-25: 90 ug/kg/min via INTRAVENOUS
  Administered 2023-03-25: 50 mg via INTRAVENOUS

## 2023-03-25 SURGICAL SUPPLY — 43 items
BAG COUNTER SPONGE SURGICOUNT (BAG) IMPLANT
BAG DECANTER FOR FLEXI CONT (MISCELLANEOUS) IMPLANT
BAG SPEC THK2 15X12 ZIP CLS (MISCELLANEOUS)
BAG SPNG CNTER NS LX DISP (BAG)
BAG ZIPLOCK 12X15 (MISCELLANEOUS) IMPLANT
BLADE SAG 18X100X1.27 (BLADE) ×1 IMPLANT
COVER PERINEAL POST (MISCELLANEOUS) ×1 IMPLANT
COVER SURGICAL LIGHT HANDLE (MISCELLANEOUS) ×1 IMPLANT
CUP ACETBLR 54 OD PINNACLE (Hips) IMPLANT
DRAPE FOOT SWITCH (DRAPES) ×1 IMPLANT
DRAPE STERI IOBAN 125X83 (DRAPES) ×1 IMPLANT
DRAPE U-SHAPE 47X51 STRL (DRAPES) ×2 IMPLANT
DRSG AQUACEL AG ADV 3.5X10 (GAUZE/BANDAGES/DRESSINGS) ×1 IMPLANT
DURAPREP 26ML APPLICATOR (WOUND CARE) ×1 IMPLANT
ELECT REM PT RETURN 15FT ADLT (MISCELLANEOUS) ×1 IMPLANT
GLOVE BIO SURGEON STRL SZ 6.5 (GLOVE) IMPLANT
GLOVE BIO SURGEON STRL SZ7.5 (GLOVE) IMPLANT
GLOVE BIO SURGEON STRL SZ8 (GLOVE) ×1 IMPLANT
GLOVE BIOGEL PI IND STRL 6.5 (GLOVE) IMPLANT
GLOVE BIOGEL PI IND STRL 7.0 (GLOVE) IMPLANT
GLOVE BIOGEL PI IND STRL 8 (GLOVE) ×1 IMPLANT
GOWN STRL REUS W/ TWL LRG LVL3 (GOWN DISPOSABLE) ×1 IMPLANT
GOWN STRL REUS W/ TWL XL LVL3 (GOWN DISPOSABLE) IMPLANT
GOWN STRL REUS W/TWL LRG LVL3 (GOWN DISPOSABLE) ×1
GOWN STRL REUS W/TWL XL LVL3 (GOWN DISPOSABLE)
HEAD CERAMIC DELTA 36 PLUS 1.5 (Hips) IMPLANT
HOLDER FOLEY CATH W/STRAP (MISCELLANEOUS) ×1 IMPLANT
KIT TURNOVER KIT A (KITS) IMPLANT
LINER MARATHON NEUT +4X54X36 (Hips) IMPLANT
MANIFOLD NEPTUNE II (INSTRUMENTS) ×1 IMPLANT
PACK ANTERIOR HIP CUSTOM (KITS) ×1 IMPLANT
PENCIL SMOKE EVACUATOR COATED (MISCELLANEOUS) ×1 IMPLANT
SPIKE FLUID TRANSFER (MISCELLANEOUS) ×1 IMPLANT
STEM FEMORAL SZ 6MM STD ACTIS (Stem) IMPLANT
STRIP CLOSURE SKIN 1/2X4 (GAUZE/BANDAGES/DRESSINGS) ×1 IMPLANT
SUT ETHIBOND NAB CT1 #1 30IN (SUTURE) ×1 IMPLANT
SUT MNCRL AB 4-0 PS2 18 (SUTURE) ×1 IMPLANT
SUT STRATAFIX 0 PDS 27 VIOLET (SUTURE) ×1
SUT VIC AB 2-0 CT1 27 (SUTURE) ×2
SUT VIC AB 2-0 CT1 TAPERPNT 27 (SUTURE) ×2 IMPLANT
SUTURE STRATFX 0 PDS 27 VIOLET (SUTURE) ×1 IMPLANT
TRAY FOLEY MTR SLVR 16FR STAT (SET/KITS/TRAYS/PACK) ×1 IMPLANT
TUBE SUCTION HIGH CAP CLEAR NV (SUCTIONS) ×1 IMPLANT

## 2023-03-25 NOTE — Anesthesia Postprocedure Evaluation (Signed)
Anesthesia Post Note  Patient: Nicholas Nash  Procedure(s) Performed: TOTAL HIP ARTHROPLASTY ANTERIOR APPROACH (Left: Hip)     Patient location during evaluation: PACU Anesthesia Type: MAC and Spinal Level of consciousness: awake and alert Pain management: pain level controlled Vital Signs Assessment: post-procedure vital signs reviewed and stable Respiratory status: spontaneous breathing, nonlabored ventilation and respiratory function stable Cardiovascular status: stable and blood pressure returned to baseline Postop Assessment: no apparent nausea or vomiting Anesthetic complications: no   No notable events documented.  Last Vitals:  Vitals:   03/25/23 1639 03/25/23 1655  BP:  107/78  Pulse: (!) 55 (!) 54  Resp: 15 16  Temp:  36.5 C  SpO2: 96% 99%    Last Pain:  Vitals:   03/25/23 1809  TempSrc:   PainSc: 6                  Syler Norcia

## 2023-03-25 NOTE — Op Note (Signed)
OPERATIVE REPORT- TOTAL HIP ARTHROPLASTY   PREOPERATIVE DIAGNOSIS: Osteoarthritis of the Left hip.   POSTOPERATIVE DIAGNOSIS: Osteoarthritis of the Left  hip.   PROCEDURE: Left total hip arthroplasty, anterior approach.   SURGEON: Ollen Gross, MD   ASSISTANT: Leilani Able, PA-C  ANESTHESIA:  Spinal  ESTIMATED BLOOD LOSS:-400 mL    DRAINS: None  COMPLICATIONS: None   CONDITION: PACU - hemodynamically stable.   BRIEF CLINICAL NOTE: Nicholas Nash is a 52 y.o. male who has advanced end-  stage arthritis of their Left  hip with progressively worsening pain and  dysfunction.The patient has failed nonoperative management and presents for  total hip arthroplasty.   PROCEDURE IN DETAIL: After successful administration of spinal  anesthetic, the traction boots for the [facility]a bed were placed on both  feet and the patient was placed onto the [facility] bed, boots placed into the leg  holders. The Left hip was then isolated from the perineum with plastic  drapes and prepped and draped in the usual sterile fashion. ASIS and  greater trochanter were marked and a oblique incision was made, starting  at about 1 cm lateral and 2 cm distal to the ASIS and coursing towards  the anterior cortex of the femur. The skin was cut with a 10 blade  through subcutaneous tissue to the level of the fascia overlying the  tensor fascia lata muscle. The fascia was then incised in line with the  incision at the junction of the anterior third and posterior 2/3rd. The  muscle was teased off the fascia and then the interval between the TFL  and the rectus was developed. The Hohmann retractor was then placed at  the top of the femoral neck over the capsule. The vessels overlying the  capsule were cauterized and the fat on top of the capsule was removed.  A Hohmann retractor was then placed anterior underneath the rectus  femoris to give exposure to the entire anterior capsule. A T-shaped  capsulotomy was  performed. The edges were tagged and the femoral head  was identified.       Osteophytes are removed off the superior acetabulum.  The femoral neck was then cut in situ with an oscillating saw. Traction  was then applied to the left lower extremity utilizing the [facility]  traction. The femoral head was then removed. Retractors were placed  around the acetabulum and then circumferential removal of the labrum was  performed. Osteophytes were also removed. Reaming starts at 51 mm to  medialize and  Increased in 2 mm increments to 53 mm. We reamed in  approximately 40 degrees of abduction, 20 degrees anteversion. A 54 mm  pinnacle acetabular shell was then impacted in anatomic position under  fluoroscopic guidance with excellent purchase. We did not need to place  any additional dome screws. A 36 mm neutral + 4 marathon liner was then  placed into the acetabular shell.       The femoral lift was then placed along the lateral aspect of the femur  just distal to the vastus ridge. The leg was  externally rotated and capsule  was stripped off the inferior aspect of the femoral neck down to the  level of the lesser trochanter, this was done with electrocautery. The femur was lifted after this was performed. The  leg was then placed in an extended and adducted position essentially delivering the femur. We also removed the capsule superiorly and the piriformis from the piriformis fossa to  gain excellent exposure of the  proximal femur. Rongeur was used to remove some cancellous bone to get  into the lateral portion of the proximal femur for placement of the  initial starter reamer. The starter broaches was placed  the starter broach  and was shown to go down the center of the canal. Broaching  with the Actis system was then performed starting at size 0  coursing  Up to size 6. A size 6 had excellent torsional and rotational  and axial stability. The trial standard offset neck was then placed  with a 36 +  1.5 trial head. The hip was then reduced. We confirmed that  the stem was in the canal both on AP and lateral x-rays. It also has excellent sizing. The hip was reduced with outstanding stability through full extension and full external rotation.. AP pelvis was taken and the leg lengths were measured and found to be equal. Hip was then dislocated again and the femoral head and neck removed. The  femoral broach was removed. Size 6 Actis stem with a standard offset  neck was then impacted into the femur following native anteversion. Has  excellent purchase in the canal. Excellent torsional and rotational and  axial stability. It is confirmed to be in the canal on AP and lateral  fluoroscopic views. The 36 + 1.5 ceramic head was placed and the hip  reduced with outstanding stability. Again AP pelvis was taken and it  confirmed that the leg lengths were equal. The wound was then copiously  irrigated with saline solution and the capsule reattached and repaired  with Ethibond suture. 30 ml of .25% Bupivicaine was  injected into the capsule and into the edge of the tensor fascia lata as well as subcutaneous tissue. The fascia overlying the tensor fascia lata was then closed with a running #1 V-Loc. Subcu was closed with interrupted 2-0 Vicryl and subcuticular running 4-0 Monocryl. Incision was cleaned  and dried. Steri-Strips and a bulky sterile dressing applied. The patient was awakened and transported to  recovery in stable condition.        Please note that a surgical assistant was a medical necessity for this procedure to perform it in a safe and expeditious manner. Assistant was necessary to provide appropriate retraction of vital neurovascular structures and to prevent femoral fracture and allow for anatomic placement of the prosthesis.  Ollen Gross, M.D.

## 2023-03-25 NOTE — Discharge Instructions (Signed)
Nicholas Aluisio, MD Total Joint Specialist [facility] [place] [address] [place], [place] [address] [phone]  ANTERIOR APPROACH TOTAL HIP REPLACEMENT POSTOPERATIVE DIRECTIONS     Hip Rehabilitation, Guidelines Following Surgery  The results of a hip operation are greatly improved after range of motion and muscle strengthening exercises. Follow all safety measures which are given to protect your hip. If any of these exercises cause increased pain or swelling in your joint, decrease the amount until you are comfortable again. Then slowly increase the exercises. Call your caregiver if you have problems or questions.   BLOOD CLOT PREVENTION Take an 81 mg Aspirin two times a day for three weeks following surgery. Then take an 81 mg Aspirin once a day for three weeks. Then discontinue Aspirin. You may resume your vitamins/supplements upon discharge from the hospital. Do not take any NSAIDs (Advil, Aleve, Ibuprofen, Meloxicam, etc.) until you are 3 weeks out from surgery  HOME CARE INSTRUCTIONS  Remove items at home which could result in a fall. This includes throw rugs or furniture in walking pathways.  ICE to the affected hip as frequently as 20-30 minutes an hour and then as needed for pain and swelling. Continue to use ice on the hip for pain and swelling from surgery. You may notice swelling that will progress down to the foot and ankle. This is normal after surgery. Elevate the leg when you are not up walking on it.   Continue to use the breathing machine which will help keep your temperature down.  It is common for your temperature to cycle up and down following surgery, especially at night when you are not up moving around and exerting yourself.  The breathing machine keeps your lungs expanded and your temperature down.  DIET You may resume your previous home diet once your are discharged from the hospital.  DRESSING / WOUND CARE / SHOWERING You have an  adhesive waterproof bandage over the incision. Leave this in place until your first follow-up appointment. Once you remove this you will not need to place another bandage.  You may begin showering 3 days following surgery, but do not submerge the incision under water.  ACTIVITY For the first 3-5 days, it is important to rest and keep the operative leg elevated. You should, as a general rule, rest for 50 minutes and walk/stretch for 10 minutes per hour. After 5 days, you may slowly increase activity as tolerated.  Perform the exercises you were provided twice a day for about 15-20 minutes each session. Begin these 2 days following surgery. Walk with your walker as instructed. Use the walker until you are comfortable transitioning to a cane. Walk with the cane in the opposite hand of the operative leg. You may discontinue the cane once you are comfortable and walking steadily. Avoid periods of inactivity such as sitting longer than an hour when not asleep. This helps prevent blood clots.  Do not drive a car for 6 weeks or until released by your surgeon.  Do not drive while taking narcotics.  TED HOSE STOCKINGS Wear the elastic stockings on both legs for three weeks following surgery during the day. You may remove them at night while sleeping.  WEIGHT BEARING Weight bearing as tolerated with assist device (walker, cane, etc) as directed, use it as long as suggested by your surgeon or therapist, typically at least 4-6 weeks.  POSTOPERATIVE CONSTIPATION PROTOCOL Constipation - defined medically as fewer than three stools per week and severe constipation as   less than one stool per week.  One of the most common issues patients have following surgery is constipation.  Even if you have a regular bowel pattern at home, your normal regimen is likely to be disrupted due to multiple reasons following surgery.  Combination of anesthesia, postoperative narcotics, change in appetite and fluid intake all can  affect your bowels.  In order to avoid complications following surgery, here are some recommendations in order to help you during your recovery period.  Colace (docusate) - Pick up an over-the-counter form of Colace or another stool softener and take twice a day as long as you are requiring postoperative pain medications.  Take with a full glass of water daily.  If you experience loose stools or diarrhea, hold the colace until you stool forms back up.  If your symptoms do not get better within 1 week or if they get worse, check with your doctor. Dulcolax (bisacodyl) - Pick up over-the-counter and take as directed by the product packaging as needed to assist with the movement of your bowels.  Take with a full glass of water.  Use this product as needed if not relieved by Colace only.  MiraLax (polyethylene glycol) - Pick up over-the-counter to have on hand.  MiraLax is a solution that will increase the amount of water in your bowels to assist with bowel movements.  Take as directed and can mix with a glass of water, juice, soda, coffee, or tea.  Take if you go more than two days without a movement.Do not use MiraLax more than once per day. Call your doctor if you are still constipated or irregular after using this medication for 7 days in a row.  If you continue to have problems with postoperative constipation, please contact the office for further assistance and recommendations.  If you experience "the worst abdominal pain ever" or develop nausea or vomiting, please contact the office immediatly for further recommendations for treatment.  ITCHING  If you experience itching with your medications, try taking only a single pain pill, or even half a pain pill at a time.  You can also use Benadryl over the counter for itching or also to help with sleep.   MEDICATIONS See your medication summary on the "After Visit Summary" that the nursing staff will review with you prior to discharge.  You may have some home  medications which will be placed on hold until you complete the course of blood thinner medication.  It is important for you to complete the blood thinner medication as prescribed by your surgeon.  Continue your approved medications as instructed at time of discharge.  PRECAUTIONS If you experience chest pain or shortness of breath - call 911 immediately for transfer to the hospital emergency department.  If you develop a fever greater that 101 F, purulent drainage from wound, increased redness or drainage from wound, foul odor from the wound/dressing, or calf pain - CONTACT YOUR SURGEON.                                                   FOLLOW-UP APPOINTMENTS Make sure you keep all of your appointments after your operation with your surgeon and caregivers. You should call the office at the above phone number and make an appointment for approximately two weeks after the date of your surgery or on the   date instructed by your surgeon outlined in the "After Visit Summary".  RANGE OF MOTION AND STRENGTHENING EXERCISES  These exercises are designed to help you keep full movement of your hip joint. Follow your caregiver's or physical therapist's instructions. Perform all exercises about fifteen times, three times per day or as directed. Exercise both hips, even if you have had only one joint replacement. These exercises can be done on a training (exercise) mat, on the floor, on a table or on a bed. Use whatever works the best and is most comfortable for you. Use music or television while you are exercising so that the exercises are a pleasant break in your day. This will make your life better with the exercises acting as a break in routine you can look forward to.  Lying on your back, slowly slide your foot toward your buttocks, raising your knee up off the floor. Then slowly slide your foot back down until your leg is straight again.  Lying on your back spread your legs as far apart as you can without causing  discomfort.  Lying on your side, raise your upper leg and foot straight up from the floor as far as is comfortable. Slowly lower the leg and repeat.  Lying on your back, tighten up the muscle in the front of your thigh (quadriceps muscles). You can do this by keeping your leg straight and trying to raise your heel off the floor. This helps strengthen the largest muscle supporting your knee.  Lying on your back, tighten up the muscles of your buttocks both with the legs straight and with the knee bent at a comfortable angle while keeping your heel on the floor.   POST-OPERATIVE OPIOID TAPER INSTRUCTIONS: It is important to wean off of your opioid medication as soon as possible. If you do not need pain medication after your surgery it is ok to stop day one. Opioids include: Codeine, Hydrocodone(Norco, Vicodin), Oxycodone(Percocet, oxycontin) and hydromorphone amongst others.  Long term and even short term use of opiods can cause: Increased pain response Dependence Constipation Depression Respiratory depression And more.  Withdrawal symptoms can include Flu like symptoms Nausea, vomiting And more Techniques to manage these symptoms Hydrate well Eat regular healthy meals Stay active Use relaxation techniques(deep breathing, meditating, yoga) Do Not substitute Alcohol to help with tapering If you have been on opioids for less than two weeks and do not have pain than it is ok to stop all together.  Plan to wean off of opioids This plan should start within one week post op of your joint replacement. Maintain the same interval or time between taking each dose and first decrease the dose.  Cut the total daily intake of opioids by one tablet each day Next start to increase the time between doses. The last dose that should be eliminated is the evening dose.   IF YOU ARE TRANSFERRED TO A SKILLED REHAB FACILITY If the patient is transferred to a skilled rehab facility following release from the  hospital, a list of the current medications will be sent to the facility for the patient to continue.  When discharged from the skilled rehab facility, please have the facility set up the patient's Home Health Physical Therapy prior to being released. Also, the skilled facility will be responsible for providing the patient with their medications at time of release from the facility to include their pain medication, the muscle relaxants, and their blood thinner medication. If the patient is still at the rehab facility   at time of the two week follow up appointment, the skilled rehab facility will also need to assist the patient in arranging follow up appointment in our office and any transportation needs.  MAKE SURE YOU:  Understand these instructions.  Get help right away if you are not doing well or get worse.    DENTAL ANTIBIOTICS:  In most cases prophylactic antibiotics for Dental procdeures after total joint surgery are not necessary.  Exceptions are as follows:  1. History of prior total joint infection  2. Severely immunocompromised (Organ Transplant, cancer chemotherapy, Rheumatoid biologic meds such as Humera)  3. Poorly controlled diabetes (A1C &gt; 8.0, blood glucose over 200)  If you have one of these conditions, contact your surgeon for an antibiotic prescription, prior to your dental procedure.    Pick up stool softner and laxative for home use following surgery while on pain medications. Do not submerge incision under water. Please use good hand washing techniques while changing dressing each day. May shower starting three days after surgery. Please use a clean towel to pat the incision dry following showers. Continue to use ice for pain and swelling after surgery. Do not use any lotions or creams on the incision until instructed by your surgeon.  

## 2023-03-25 NOTE — Interval H&P Note (Signed)
History and Physical Interval Note:  03/25/2023 11:27 AM  Nicholas Nash  has presented today for surgery, with the diagnosis of left hip osteoarthritis.  The various methods of treatment have been discussed with the patient and family. After consideration of risks, benefits and other options for treatment, the patient has consented to  Procedure(s): TOTAL HIP ARTHROPLASTY ANTERIOR APPROACH (Left) as a surgical intervention.  The patient's history has been reviewed, patient examined, no change in status, stable for surgery.  I have reviewed the patient's chart and labs.  Questions were answered to the patient's satisfaction.     Homero Fellers Josearmando Kuhnert

## 2023-03-25 NOTE — Transfer of Care (Signed)
Immediate Anesthesia Transfer of Care Note  Patient: Nicholas Nash  Procedure(s) Performed: Procedure(s): TOTAL HIP ARTHROPLASTY ANTERIOR APPROACH (Left)  Patient Location: PACU  Anesthesia Type:Spinal  Level of Consciousness: awake, alert  and oriented  Airway & Oxygen Therapy: Patient Spontanous Breathing  Post-op Assessment: Report given to RN and Post -op Vital signs reviewed and stable  Post vital signs: Reviewed and stable  Last Vitals:  Vitals:   03/25/23 1142  BP: (!) 138/90  Pulse: 63  Resp: 18  Temp: 36.9 C  SpO2: 96%    Complications: No apparent anesthesia complications

## 2023-03-25 NOTE — Anesthesia Preprocedure Evaluation (Addendum)
Anesthesia Evaluation  Patient identified by MRN, date of birth, ID band Patient awake    Reviewed: Allergy & Precautions, NPO status , Patient's Chart, lab work & pertinent test results  History of Anesthesia Complications Negative for: history of anesthetic complications  Airway Mallampati: II  TM Distance: >3 FB Neck ROM: Full    Dental  (+) Dental Advisory Given   Pulmonary sleep apnea    breath sounds clear to auscultation       Cardiovascular negative cardio ROS  Rhythm:Regular     Neuro/Psych  PSYCHIATRIC DISORDERS Anxiety Depression     Neuromuscular disease    GI/Hepatic Neg liver ROS,GERD  ,,  Endo/Other  negative endocrine ROS    Renal/GU negative Renal ROS     Musculoskeletal  (+) Arthritis ,    Abdominal   Peds  Hematology negative hematology ROS (+)   Anesthesia Other Findings   Reproductive/Obstetrics                              Anesthesia Physical Anesthesia Plan  ASA: 2  Anesthesia Plan: MAC and Spinal   Post-op Pain Management:    Induction: Intravenous  PONV Risk Score and Plan: 1 and Propofol infusion  Airway Management Planned: Natural Airway, Nasal Cannula and Simple Face Mask  Additional Equipment: None  Intra-op Plan:   Post-operative Plan:   Informed Consent: I have reviewed the patients History and Physical, chart, labs and discussed the procedure including the risks, benefits and alternatives for the proposed anesthesia with the patient or authorized representative who has indicated his/her understanding and acceptance.     Dental advisory given  Plan Discussed with: CRNA  Anesthesia Plan Comments:         Anesthesia Quick Evaluation

## 2023-03-26 ENCOUNTER — Encounter (HOSPITAL_COMMUNITY): Payer: Self-pay | Admitting: Orthopedic Surgery

## 2023-03-26 DIAGNOSIS — M1612 Unilateral primary osteoarthritis, left hip: Secondary | ICD-10-CM | POA: Diagnosis not present

## 2023-03-26 LAB — CBC
HCT: 37 % — ABNORMAL LOW (ref 39.0–52.0)
Hemoglobin: 12.5 g/dL — ABNORMAL LOW (ref 13.0–17.0)
MCH: 31 pg (ref 26.0–34.0)
MCHC: 33.8 g/dL (ref 30.0–36.0)
MCV: 91.8 fL (ref 80.0–100.0)
Platelets: 284 10*3/uL (ref 150–400)
RBC: 4.03 MIL/uL — ABNORMAL LOW (ref 4.22–5.81)
RDW: 12.2 % (ref 11.5–15.5)
WBC: 17.8 10*3/uL — ABNORMAL HIGH (ref 4.0–10.5)
nRBC: 0 % (ref 0.0–0.2)

## 2023-03-26 LAB — BASIC METABOLIC PANEL
Anion gap: 8 (ref 5–15)
BUN: 18 mg/dL (ref 6–20)
CO2: 25 mmol/L (ref 22–32)
Calcium: 8.4 mg/dL — ABNORMAL LOW (ref 8.9–10.3)
Chloride: 103 mmol/L (ref 98–111)
Creatinine, Ser: 1.4 mg/dL — ABNORMAL HIGH (ref 0.61–1.24)
GFR, Estimated: >60 mL/min (ref >60)
Glucose, Bld: 154 mg/dL — ABNORMAL HIGH (ref 70–99)
Potassium: 3.7 mmol/L (ref 3.5–5.1)
Sodium: 136 mmol/L (ref 135–145)

## 2023-03-26 MED ORDER — ASPIRIN 81 MG PO CHEW: 81.0000 mg | CHEWABLE_TABLET | Freq: Two times a day (BID) | ORAL | 0 refills | Status: AC

## 2023-03-26 MED ORDER — METHOCARBAMOL 500 MG PO TABS: 500.0000 mg | ORAL_TABLET | Freq: Four times a day (QID) | ORAL | 0 refills | Status: AC | PRN

## 2023-03-26 MED ORDER — HYDROCODONE-ACETAMINOPHEN 5-325 MG PO TABS: 1.0000 | ORAL_TABLET | Freq: Four times a day (QID) | ORAL | 0 refills | Status: AC | PRN

## 2023-03-26 NOTE — Plan of Care (Signed)
Pt ready to DC home with wife. 

## 2023-03-26 NOTE — TOC Transition Note (Signed)
Transition of Care [facility]) - CM/SW Discharge Note  Patient Details  Name: CARDALE DORER MRN: [id number] Date of Birth: 01-Mar-1971  [facility] CM/SW Contact:  Ewing Schlein, LCSW Phone Number: 03/26/2023, 11:03 AM  Clinical Narrative: Patient is expected to discharge home after working with PT. CSW met with patient to confirm discharge plan. Patient will go home with a home exercise program (HEP). Patient has a rolling walker at home, so there are no DME needs at this time. TOC signing off.    Barriers to Discharge: No Barriers Identified  Patient Goals and CMS Choice Choice offered to / list presented to : NA  Discharge Plan and Services Additional resources added to the After Visit Summary for           DME Arranged: N/A DME Agency: NA  Social Determinants of Health (SDOH) Interventions SDOH Screenings   Food Insecurity: No Food Insecurity (03/25/2023)  Housing: Low Risk  (03/25/2023)  Transportation Needs: No Transportation Needs (03/25/2023)  Utilities: Not At Risk (03/25/2023)  Tobacco Use: Low Risk  (03/25/2023)   Readmission Risk Interventions     No data to display

## 2023-03-26 NOTE — Evaluation (Signed)
Physical Therapy Evaluation Patient Details Name: Nicholas Nash MRN: [id number] DOB: 1971/10/27 Today's Date: 03/26/2023  History of Present Illness  52 y.o. male admitted 03/25/23 for L AA-THA. PMH: R TKA, L TKA, R THA, back surgery, anxiety, depression PTSD, sleep apnea.  Clinical Impression  Pt is mobilizing well and is ready to DC home from a PT standpoint. He ambulated 150' with RW, no loss of balance. He demonstrates good understanding of HEP.         Recommendations for follow up therapy are one component of a multi-disciplinary discharge planning process, led by the attending physician.  Recommendations may be updated based on patient status, additional functional criteria and insurance authorization.  Follow Up Recommendations       Assistance Recommended at Discharge Set up Supervision/Assistance  Patient can return home with the following  A little help with bathing/dressing/bathroom;Assist for transportation    Equipment Recommendations None recommended by PT  Recommendations for Other Services       Functional Status Assessment       Precautions / Restrictions Precautions Precautions: Fall Restrictions Weight Bearing Restrictions: No Other Position/Activity Restrictions: WBAT LLE      Mobility  Bed Mobility               General bed mobility comments: up in recliner    Transfers Overall transfer level: Modified independent Equipment used: Rolling walker (2 wheels)                    Ambulation/Gait Ambulation/Gait assistance: Modified independent (Device/Increase time) Gait Distance (Feet): 150 Feet Assistive device: Rolling walker (2 wheels) Gait Pattern/deviations: Step-to pattern Gait velocity: WFL     General Gait Details: steady, no loss of balance  Stairs            Wheelchair Mobility    Modified Rankin (Stroke Patients Only)       Balance Overall balance assessment: Modified Independent                                            Pertinent Vitals/Pain Pain Assessment Pain Assessment: 0-10 Pain Score: 3  Pain Location: L hip Pain Descriptors / Indicators: Sore Pain Intervention(s): Limited activity within patient's tolerance, Monitored during session, Premedicated before session, Ice applied    Home Living Family/patient expects to be discharged to:: Private residence Living Arrangements: Spouse/significant other;Children Available Help at Discharge: Family;Available 24 hours/day Type of Home: House Home Access: Level entry       Home Layout: Multi-level;Able to live on main level with bedroom/bathroom Home Equipment: Rolling Walker (2 wheels);Rollator (4 wheels);BSC/3in1      Prior Function Prior Level of Function : Independent/Modified Independent             Mobility Comments: walked without AD, no falls in past 6 months ADLs Comments: independent     Hand Dominance        Extremity/Trunk Assessment   Upper Extremity Assessment Upper Extremity Assessment: Overall WFL for tasks assessed    Lower Extremity Assessment Lower Extremity Assessment: LLE deficits/detail LLE Deficits / Details: knee ext at least 3/5, hip flexion AAROM ~40* LLE Sensation: WNL    Cervical / Trunk Assessment Cervical / Trunk Assessment: Normal  Communication   Communication: No difficulties  Cognition Arousal/Alertness: Awake/alert Behavior During Therapy: WFL for tasks assessed/performed Overall Cognitive Status: Within Functional Limits for tasks  assessed                                          General Comments      Exercises Total Joint Exercises Ankle Circles/Pumps: AROM, Both, 10 reps, Supine Quad Sets: AROM, Both, 5 reps, Supine Short Arc Quad: AROM, Left, 10 reps, Supine Heel Slides: AAROM, Left, 10 reps Hip ABduction/ADduction: AAROM, Left, 10 reps Long Arc Quad: AROM, Left, 10 reps   Assessment/Plan    PT Assessment Patient  does not need any further PT services  PT Problem List         PT Treatment Interventions      PT Goals (Current goals can be found in the Care Plan section)  Acute Rehab PT Goals Patient Stated Goal: walk dogs PT Goal Formulation: All assessment and education complete, DC therapy    Frequency       Co-evaluation               AM-PAC PT "6 Clicks" Mobility  Outcome Measure Help needed turning from your back to your side while in a flat bed without using bedrails?: None Help needed moving from lying on your back to sitting on the side of a flat bed without using bedrails?: None Help needed moving to and from a bed to a chair (including a wheelchair)?: None Help needed standing up from a chair using your arms (e.g., wheelchair or bedside chair)?: None Help needed to walk in hospital room?: None Help needed climbing 3-5 steps with a railing? : None 6 Click Score: 24    End of Session Equipment Utilized During Treatment: Gait belt Activity Tolerance: Patient tolerated treatment well Patient left: in chair;with call bell/phone within reach Nurse Communication: Mobility status PT Visit Diagnosis: Pain Pain - Right/Left: Left Pain - part of body: Hip    Time: 1610-9604 PT Time Calculation (min) (ACUTE ONLY): 21 min   Charges:   PT Evaluation $PT Eval Moderate Complexity: 1 Mod         Tamala Ser PT 03/26/2023  Acute Rehabilitation Services  Office [phone]

## 2023-03-26 NOTE — Plan of Care (Signed)
Plan of care reviewed and discussed. Problem: Education: Goal: Knowledge of the prescribed therapeutic regimen will improve Outcome: Adequate for Discharge Goal: Understanding of discharge needs will improve Outcome: Progressing Goal: Individualized Educational Video(s) Outcome: Completed/Met   Problem: Activity: Goal: Ability to avoid complications of mobility impairment will improve Outcome: Progressing Goal: Ability to tolerate increased activity will improve Outcome: Adequate for Discharge   Problem: Clinical Measurements: Goal: Postoperative complications will be avoided or minimized Outcome: Progressing   Problem: Pain Management: Goal: Pain level will decrease with appropriate interventions Outcome: Progressing   Problem: Skin Integrity: Goal: Will show signs of wound healing Outcome: Progressing   Problem: Education: Goal: Knowledge of General Education information will improve Description: Including pain rating scale, medication(s)/side effects and non-pharmacologic comfort measures Outcome: Adequate for Discharge   Problem: Education: Goal: Knowledge of General Education information will improve Description: Including pain rating scale, medication(s)/side effects and non-pharmacologic comfort measures Outcome: Adequate for Discharge   Problem: Health Behavior/Discharge Planning: Goal: Ability to manage health-related needs will improve Outcome: Progressing   Problem: Clinical Measurements: Goal: Ability to maintain clinical measurements within normal limits will improve Outcome: Progressing Goal: Will remain free from infection Outcome: Progressing Goal: Diagnostic test results will improve Outcome: Progressing Goal: Respiratory complications will improve Outcome: Progressing Goal: Cardiovascular complication will be avoided Outcome: Progressing

## 2023-03-26 NOTE — Progress Notes (Signed)
   Subjective: 1 Day Post-Op Procedure(s) (LRB): TOTAL HIP ARTHROPLASTY ANTERIOR APPROACH (Left) Patient reports pain as mild.   Patient seen in rounds by Dr. Lequita Halt. Patient is well, and has had no acute complaints or problems. Denies chest pain or SOB. No issues overnight. Foley catheter removed this AM. We will begin therapy today.   Objective: Vital signs in last 24 hours: Temp:  [97.7 F (36.5 C)-98.5 F (36.9 C)] 98.1 F (36.7 C) (04/23 0543) Pulse Rate:  [48-100] 92 (04/23 0106) Resp:  [11-22] 19 (04/23 0543) BP: (94-142)/(61-95) 130/95 (04/23 0543) SpO2:  [92 %-100 %] 97 % (04/23 0543) Weight:  [161 kg] 108 kg (04/22 1128)  Intake/Output from previous day:  Intake/Output Summary (Last 24 hours) at 03/26/2023 0820 Last data filed at 03/26/2023 0600 Gross per 24 hour  Intake 3129.81 ml  Output 1600 ml  Net 1529.81 ml     Intake/Output this shift: No intake/output data recorded.  Labs: Recent Labs    03/26/23 0436  HGB 12.5*   Recent Labs    03/26/23 0436  WBC 17.8*  RBC 4.03*  HCT 37.0*  PLT 284   Recent Labs    03/26/23 0436  NA 136  K 3.7  CL 103  CO2 25  BUN 18  CREATININE 1.40*  GLUCOSE 154*  CALCIUM 8.4*   No results for input(s): "LABPT", "INR" in the last 72 hours.  Exam: General - Patient is Alert and Oriented Extremity - Neurologically intact Neurovascular intact Sensation intact distally Dorsiflexion/Plantar flexion intact Dressing - dressing C/D/I Motor Function - intact, moving foot and toes well on exam.   Past Medical History:  Diagnosis Date   Anxiety    PTSD   Arthritis    Complication of anesthesia    makes him hiccup    Depression    GERD (gastroesophageal reflux disease)    History of kidney stones    Neuromuscular disorder    Sciatica   Pneumonia    Sleep apnea    wears cpap sometimes lost 75lbs   Tuberculosis    while in [place] in the [employer]    Assessment/Plan: 1 Day Post-Op Procedure(s) (LRB): TOTAL HIP  ARTHROPLASTY ANTERIOR APPROACH (Left) Principal Problem:   OA (osteoarthritis) of hip Active Problems:   Primary osteoarthritis of left hip  Estimated body mass index is 28.98 kg/m as calculated from the following:   Height as of this encounter:  (1.93 m).   Weight as of this encounter: 108 kg. Advance diet Up with therapy D/C IV fluids  DVT Prophylaxis - Aspirin Weight bearing as tolerated. Begin therapy.  Plan is to go Home after hospital stay. Plan for discharge with HEP later today if progresses with therapy and meeting goals. Follow-up in the office in 2 weeks.  The PDMP database was reviewed today prior to any opioid medications being prescribed to this patient.  Arther Abbott, PA-C Orthopedic Surgery [phone] 03/26/2023, 8:20 AM

## 2023-04-03 NOTE — Discharge Summary (Signed)
Patient ID: Nicholas Nash MRN: [id number] DOB/AGE: 53/07/1971 53 y.o.  Admit date: 03/25/2023 Discharge date: 03/26/2023  Admission Diagnoses:  Principal Problem:   OA (osteoarthritis) of hip Active Problems:   Primary osteoarthritis of left hip   Discharge Diagnoses:  Same  Past Medical History:  Diagnosis Date   Anxiety    PTSD   Arthritis    Complication of anesthesia    makes him hiccup    Depression    GERD (gastroesophageal reflux disease)    History of kidney stones    Neuromuscular disorder (HCC)    Sciatica   Pneumonia    Sleep apnea    wears cpap sometimes lost 75lbs   Tuberculosis    while in [place] in the [employer]    Surgeries: Procedure(s): TOTAL HIP ARTHROPLASTY ANTERIOR APPROACH on 03/25/2023   Consultants:   Discharged Condition: Improved  Hospital Course: Nicholas Nash is an 52 y.o. male who was admitted 03/25/2023 for operative treatment ofOA (osteoarthritis) of hip. Patient has severe unremitting pain that affects sleep, daily activities, and work/hobbies. After pre-op clearance the patient was taken to the operating room on 03/25/2023 and underwent  Procedure(s): TOTAL HIP ARTHROPLASTY ANTERIOR APPROACH.    Patient was given perioperative antibiotics:  Anti-infectives (From admission, onward)    Start     Dose/Rate Route Frequency Ordered Stop   03/25/23 2000  ceFAZolin (ANCEF) IVPB 2g/100 mL premix        2 g 200 mL/hr over 30 Minutes Intravenous Every 6 hours 03/25/23 1706 03/26/23 0223   03/25/23 1130  ceFAZolin (ANCEF) IVPB 2g/100 mL premix        2 g 200 mL/hr over 30 Minutes Intravenous On call to O.R. 03/25/23 1127 03/25/23 1348        Patient was given sequential compression devices, early ambulation, and chemoprophylaxis to prevent DVT.  Patient benefited maximally from hospital stay and there were no complications.    Recent vital signs: No data found.   Recent laboratory studies: No results for input(s): "WBC", "HGB", "HCT",  "PLT", "NA", "K", "CL", "CO2", "BUN", "CREATININE", "GLUCOSE", "INR", "CALCIUM" in the last 72 hours.  Invalid input(s): "PT", "2"   Discharge Medications:   Allergies as of 03/26/2023   No Known Allergies      Medication List     STOP taking these medications    ibuprofen 800 MG tablet Commonly known as: ADVIL       TAKE these medications    ascorbic acid 500 MG tablet Commonly known as: VITAMIN C Take 500 mg by mouth daily.   aspirin 81 MG chewable tablet Chew 1 tablet (81 mg total) by mouth 2 (two) times daily. Take an 81 mg Aspirin two times a day for three weeks following surgery. Then take an 81 mg Aspirin once a day for three weeks. Then discontinue Aspirin.   buPROPion 100 MG tablet Commonly known as: WELLBUTRIN Take 100 mg by mouth in the morning.   gabapentin 300 MG capsule Commonly known as: NEURONTIN Take 1 capsule (300 mg total) by mouth 3 (three) times daily. Gabapentin 300 mg Protocol Take a 300 mg capsule three times a day for two weeks following surgery. Then take a 300 mg capsule two times a day for two weeks.  Then take a 300 mg capsule once a day for two weeks.  Then discontinue the Gabapentin. What changed:  when to take this reasons to take this additional instructions   HYDROcodone-acetaminophen 5-325 MG tablet Commonly known  as: NORCO/VICODIN Take 1-2 tablets by mouth every 6 (six) hours as needed for moderate pain or severe pain.   methocarbamol 500 MG tablet Commonly known as: ROBAXIN Take 1 tablet (500 mg total) by mouth every 6 (six) hours as needed for muscle spasms.   omeprazole 20 MG capsule Commonly known as: PRILOSEC Take 20 mg by mouth daily before breakfast.   venlafaxine XR 150 MG 24 hr capsule Commonly known as: EFFEXOR-XR Take 150 mg by mouth daily with breakfast.   Vitamin D-3 25 MCG (1000 UT) Caps Take 1,000 Units by mouth in the morning.               Discharge Care Instructions  (From admission,  onward)           Start     Ordered   03/26/23 0000  Weight bearing as tolerated        03/26/23 0823   03/26/23 0000  Change dressing       Comments: You have an adhesive waterproof bandage over the incision. Leave this in place until your first follow-up appointment. Once you remove this you will not need to place another bandage.   03/26/23 0823            Diagnostic Studies: DG Pelvis Portable  Result Date: 03/25/2023 CLINICAL DATA:  Status post hip replacement. EXAM: PORTABLE PELVIS 1-2 VIEWS COMPARISON:  Pelvic radiograph 12/07/2020 FINDINGS: Left hip arthroplasty in expected alignment. No periprosthetic lucency or fracture. Recent postsurgical change includes air and edema in the soft tissues. Previous right hip arthroplasty. IMPRESSION: Left hip arthroplasty without immediate postoperative complication. Electronically Signed   By: Narda Rutherford M.D.   On: 03/25/2023 16:26   DG HIP UNILAT WITH PELVIS 2-3 VIEWS LEFT  Result Date: 03/25/2023 CLINICAL DATA:  Total left hip arthroplasty. Intraoperative fluoroscopy. EXAM: DG HIP (WITH OR WITHOUT PELVIS) 2-3V LEFT COMPARISON:  AP pelvis 12/07/2020 FINDINGS: Images were performed intraoperatively without the presence of a radiologist. Moderate to severe left femoroacetabular joint space narrowing and peripheral osteophytosis. The patient is undergoing total left hip arthroplasty. Redemonstration of prior total right hip arthroplasty. No hardware complication is seen. Total fluoroscopy images: 7 Total fluoroscopy time: 13 seconds Total dose: Radiation Exposure Index (as provided by the fluoroscopic device): 1.66 mGy air Kerma Please see intraoperative findings for further detail. IMPRESSION: Intraoperative fluoroscopy during total left hip arthroplasty. Electronically Signed   By: Neita Garnet M.D.   On: 03/25/2023 14:59   DG C-Arm 1-60 Min-No Report  Result Date: 03/25/2023 Fluoroscopy was utilized by the requesting physician.  No  radiographic interpretation.   DG C-Arm 1-60 Min-No Report  Result Date: 03/25/2023 Fluoroscopy was utilized by the requesting physician.  No radiographic interpretation.    Disposition: Discharge disposition: 01-Home or Self Care       Discharge Instructions     Call MD / Call 911   Complete by: As directed    If you experience chest pain or shortness of breath, CALL 911 and be transported to the hospital emergency room.  If you develope a fever above 101 F, pus (white drainage) or increased drainage or redness at the wound, or calf pain, call your surgeon's office.   Change dressing   Complete by: As directed    You have an adhesive waterproof bandage over the incision. Leave this in place until your first follow-up appointment. Once you remove this you will not need to place another bandage.   Constipation Prevention  Complete by: As directed    Drink plenty of fluids.  Prune juice may be helpful.  You may use a stool softener, such as Colace (over the counter) 100 mg twice a day.  Use MiraLax (over the counter) for constipation as needed.   Diet - low sodium heart healthy   Complete by: As directed    Do not sit on low chairs, stoools or toilet seats, as it may be difficult to get up from low surfaces   Complete by: As directed    Driving restrictions   Complete by: As directed    No driving for two weeks   Post-operative opioid taper instructions:   Complete by: As directed    POST-OPERATIVE OPIOID TAPER INSTRUCTIONS: It is important to wean off of your opioid medication as soon as possible. If you do not need pain medication after your surgery it is ok to stop day one. Opioids include: Codeine, Hydrocodone(Norco, Vicodin), Oxycodone(Percocet, oxycontin) and hydromorphone amongst others.  Long term and even short term use of opiods can cause: Increased pain response Dependence Constipation Depression Respiratory depression And more.  Withdrawal symptoms can  include Flu like symptoms Nausea, vomiting And more Techniques to manage these symptoms Hydrate well Eat regular healthy meals Stay active Use relaxation techniques(deep breathing, meditating, yoga) Do Not substitute Alcohol to help with tapering If you have been on opioids for less than two weeks and do not have pain than it is ok to stop all together.  Plan to wean off of opioids This plan should start within one week post op of your joint replacement. Maintain the same interval or time between taking each dose and first decrease the dose.  Cut the total daily intake of opioids by one tablet each day Next start to increase the time between doses. The last dose that should be eliminated is the evening dose.      TED hose   Complete by: As directed    Use stockings (TED hose) for three weeks on both leg(s).  You may remove them at night for sleeping.   Weight bearing as tolerated   Complete by: As directed         Follow-up Information     Aluisio, Homero Fellers, MD. Schedule an appointment as soon as possible for a visit in 2 week(s).   Specialty: Orthopedic Surgery Contact information: [address] [place] [address] [phone]                  Signed: Arther Abbott 04/03/2023, 11:17 AM

## 2023-06-02 ENCOUNTER — Encounter (HOSPITAL_BASED_OUTPATIENT_CLINIC_OR_DEPARTMENT_OTHER): Payer: Self-pay | Admitting: Emergency Medicine

## 2023-06-02 ENCOUNTER — Emergency Department (HOSPITAL_BASED_OUTPATIENT_CLINIC_OR_DEPARTMENT_OTHER)
Admission: EM | Admit: 2023-06-02 | Discharge: 2023-06-02 | Disposition: A | Discharge status: Home / Self Care | Payer: No Typology Code available for payment source | Attending: Emergency Medicine | Admitting: Emergency Medicine

## 2023-06-02 ENCOUNTER — Other Ambulatory Visit: Payer: Self-pay

## 2023-06-02 ENCOUNTER — Emergency Department (HOSPITAL_BASED_OUTPATIENT_CLINIC_OR_DEPARTMENT_OTHER): Payer: No Typology Code available for payment source

## 2023-06-02 DIAGNOSIS — Z1152 Encounter for screening for COVID-19: Secondary | ICD-10-CM | POA: Insufficient documentation

## 2023-06-02 DIAGNOSIS — Z7982 Long term (current) use of aspirin: Secondary | ICD-10-CM | POA: Diagnosis not present

## 2023-06-02 DIAGNOSIS — K529 Noninfective gastroenteritis and colitis, unspecified: Secondary | ICD-10-CM | POA: Diagnosis not present

## 2023-06-02 DIAGNOSIS — R109 Unspecified abdominal pain: Secondary | ICD-10-CM | POA: Diagnosis present

## 2023-06-02 LAB — CBC WITH DIFFERENTIAL/PLATELET
Abs Immature Granulocytes: 0.03 10*3/uL (ref 0.00–0.07)
Basophils Absolute: 0 10*3/uL (ref 0.0–0.1)
Basophils Relative: 1 %
Eosinophils Absolute: 0.1 10*3/uL (ref 0.0–0.5)
Eosinophils Relative: 1 %
HCT: 40.3 % (ref 39.0–52.0)
Hemoglobin: 13.5 g/dL (ref 13.0–17.0)
Immature Granulocytes: 0 %
Lymphocytes Relative: 10 %
Lymphs Abs: 0.9 10*3/uL (ref 0.7–4.0)
MCH: 29.9 pg (ref 26.0–34.0)
MCHC: 33.5 g/dL (ref 30.0–36.0)
MCV: 89.4 fL (ref 80.0–100.0)
Monocytes Absolute: 0.7 10*3/uL (ref 0.1–1.0)
Monocytes Relative: 8 %
Neutro Abs: 7 10*3/uL (ref 1.7–7.7)
Neutrophils Relative %: 80 %
Platelets: 229 10*3/uL (ref 150–400)
RBC: 4.51 MIL/uL (ref 4.22–5.81)
RDW: 13.2 % (ref 11.5–15.5)
WBC: 8.7 10*3/uL (ref 4.0–10.5)
nRBC: 0 % (ref 0.0–0.2)

## 2023-06-02 LAB — LACTIC ACID, PLASMA: Lactic Acid, Venous: 0.9 mmol/L (ref 0.5–1.9)

## 2023-06-02 LAB — COMPREHENSIVE METABOLIC PANEL
ALT: 22 U/L (ref 0–44)
AST: 32 U/L (ref 15–41)
Albumin: 3.7 g/dL (ref 3.5–5.0)
Alkaline Phosphatase: 69 U/L (ref 38–126)
Anion gap: 15 (ref 5–15)
BUN: 16 mg/dL (ref 6–20)
CO2: 18 mmol/L — ABNORMAL LOW (ref 22–32)
Calcium: 8.8 mg/dL — ABNORMAL LOW (ref 8.9–10.3)
Chloride: 104 mmol/L (ref 98–111)
Creatinine, Ser: 1.51 mg/dL — ABNORMAL HIGH (ref 0.61–1.24)
GFR, Estimated: 55 mL/min — ABNORMAL LOW (ref >60)
Glucose, Bld: 141 mg/dL — ABNORMAL HIGH (ref 70–99)
Potassium: 3.3 mmol/L — ABNORMAL LOW (ref 3.5–5.1)
Sodium: 137 mmol/L (ref 135–145)
Total Bilirubin: 0.6 mg/dL (ref 0.3–1.2)
Total Protein: 7.3 g/dL (ref 6.5–8.1)

## 2023-06-02 LAB — SARS CORONAVIRUS 2 BY RT PCR: SARS Coronavirus 2 by RT PCR: NEGATIVE

## 2023-06-02 LAB — LIPASE, BLOOD: Lipase: 47 U/L (ref 11–51)

## 2023-06-02 MED ORDER — OXYCODONE-ACETAMINOPHEN 5-325 MG PO TABS: 1.0000 | ORAL_TABLET | Freq: Four times a day (QID) | ORAL | 0 refills | Status: AC | PRN

## 2023-06-02 MED ORDER — ONDANSETRON HCL 4 MG/2ML IJ SOLN
4.0000 mg | Freq: Once | INTRAMUSCULAR | Status: AC
  Administered 2023-06-02: 4 mg via INTRAVENOUS
  Filled 2023-06-02: qty 2

## 2023-06-02 MED ORDER — METRONIDAZOLE 500 MG PO TABS
500.0000 mg | ORAL_TABLET | Freq: Once | ORAL | Status: AC
  Administered 2023-06-02: 500 mg via ORAL
  Filled 2023-06-02: qty 1

## 2023-06-02 MED ORDER — CIPROFLOXACIN HCL 500 MG PO TABS
500.0000 mg | ORAL_TABLET | Freq: Once | ORAL | Status: AC
  Administered 2023-06-02: 500 mg via ORAL
  Filled 2023-06-02: qty 1

## 2023-06-02 MED ORDER — SODIUM CHLORIDE 0.9 % IV BOLUS
1000.0000 mL | Freq: Once | INTRAVENOUS | Status: AC
  Administered 2023-06-02: 1000 mL via INTRAVENOUS

## 2023-06-02 MED ORDER — METRONIDAZOLE 500 MG PO TABS: 500.0000 mg | ORAL_TABLET | Freq: Two times a day (BID) | ORAL | 0 refills | Status: AC

## 2023-06-02 MED ORDER — CIPROFLOXACIN HCL 500 MG PO TABS: 500.0000 mg | ORAL_TABLET | Freq: Two times a day (BID) | ORAL | 0 refills | Status: AC

## 2023-06-02 MED ORDER — METRONIDAZOLE 500 MG PO TABS: 500.0000 mg | ORAL_TABLET | Freq: Two times a day (BID) | ORAL | 0 refills | Status: DC

## 2023-06-02 MED ORDER — DICYCLOMINE HCL 10 MG PO CAPS
10.0000 mg | ORAL_CAPSULE | Freq: Once | ORAL | Status: AC
  Administered 2023-06-02: 10 mg via ORAL
  Filled 2023-06-02: qty 1

## 2023-06-02 MED ORDER — DICYCLOMINE HCL 20 MG PO TABS: 20.0000 mg | ORAL_TABLET | Freq: Two times a day (BID) | ORAL | 0 refills | Status: DC

## 2023-06-02 MED ORDER — OXYCODONE-ACETAMINOPHEN 5-325 MG PO TABS: 1.0000 | ORAL_TABLET | Freq: Four times a day (QID) | ORAL | 0 refills | Status: DC | PRN

## 2023-06-02 MED ORDER — CIPROFLOXACIN HCL 500 MG PO TABS: 500.0000 mg | ORAL_TABLET | Freq: Two times a day (BID) | ORAL | 0 refills | Status: DC

## 2023-06-02 MED ORDER — DICYCLOMINE HCL 20 MG PO TABS: 20.0000 mg | ORAL_TABLET | Freq: Two times a day (BID) | ORAL | 0 refills | Status: AC

## 2023-06-02 MED ORDER — MORPHINE SULFATE (PF) 4 MG/ML IV SOLN
4.0000 mg | Freq: Once | INTRAVENOUS | Status: AC
  Administered 2023-06-02: 4 mg via INTRAVENOUS
  Filled 2023-06-02: qty 1

## 2023-06-02 MED ORDER — IOHEXOL 300 MG/ML  SOLN
100.0000 mL | Freq: Once | INTRAMUSCULAR | Status: AC | PRN
  Administered 2023-06-02: 100 mL via INTRAVENOUS

## 2023-06-02 NOTE — ED Triage Notes (Signed)
Pt reports he has felt unwell since Thursday with fever, abd pain radiating to leg and n/d.

## 2023-06-02 NOTE — ED Provider Notes (Signed)
[facility] Provider Note   CSN: [id number] Arrival date & time: 06/02/23  1004    History  Chief Complaint  Patient presents with   Abdominal Pain    ENO DENECKE is a 52 y.o. male past medical history here for evaluation of abdominal pain and diarrhea.  Symptoms began on Thursday.  With some mild congestion, rhinorrhea.  Abdominal pain is diffuse in nature.  He is having multiple episodes of loose stool up to 10 times daily.  No recent antibiotics, travel.  No blood in his stool.  Some nausea without vomiting.  Temperature up to 102.0 per patient.  No sore throat.  No chest pain, shortness of breath.  No pain or swelling to lower extremities.  No dysuria, hematuria, frequency.  No pain or swelling to groin.  History of kidney stones however does not feel similar.  No flank pain.  History of diverticulitis. No sick contacts  HPI     Home Medications Prior to Admission medications   Medication Sig Start Date End Date Taking? Authorizing Provider  ascorbic acid (VITAMIN C) 500 MG tablet Take 500 mg by mouth daily.    [provider]  aspirin 81 MG chewable tablet Chew 1 tablet (81 mg total) by mouth 2 (two) times daily. Take an 81 mg Aspirin two times a day for three weeks following surgery. Then take an 81 mg Aspirin once a day for three weeks. Then discontinue Aspirin. 03/26/23   Edmisten, Kristie L, PA  buPROPion (WELLBUTRIN) 100 MG tablet Take 100 mg by mouth in the morning.    [provider]  Cholecalciferol (VITAMIN D-3) 1000 units CAPS Take 1,000 Units by mouth in the morning.    [provider]  ciprofloxacin (CIPRO) 500 MG tablet Take 1 tablet (500 mg total) by mouth every 12 (twelve) hours. 06/02/23   Shron Ozer A, PA-C  dicyclomine (BENTYL) 20 MG tablet Take 1 tablet (20 mg total) by mouth 2 (two) times daily. 06/02/23   Daizy Outen A, PA-C  gabapentin (NEURONTIN) 300 MG capsule Take 1 capsule  (300 mg total) by mouth 3 (three) times daily. Gabapentin 300 mg Protocol Take a 300 mg capsule three times a day for two weeks following surgery. Then take a 300 mg capsule two times a day for two weeks.  Then take a 300 mg capsule once a day for two weeks.  Then discontinue the Gabapentin. Patient taking differently: Take 300 mg by mouth at bedtime as needed (pain.). 08/12/18   Edmisten, Lyn Hollingshead, PA  HYDROcodone-acetaminophen (NORCO/VICODIN) 5-325 MG tablet Take 1-2 tablets by mouth every 6 (six) hours as needed for moderate pain or severe pain. 03/26/23   Edmisten, Lyn Hollingshead, PA  methocarbamol (ROBAXIN) 500 MG tablet Take 1 tablet (500 mg total) by mouth every 6 (six) hours as needed for muscle spasms. 03/26/23   Edmisten, Lyn Hollingshead, PA  metroNIDAZOLE (FLAGYL) 500 MG tablet Take 1 tablet (500 mg total) by mouth 2 (two) times daily. 06/02/23   Mikinzie Maciejewski A, PA-C  omeprazole (PRILOSEC) 20 MG capsule Take 20 mg by mouth daily before breakfast.    [provider]  oxyCODONE-acetaminophen (PERCOCET/ROXICET) 5-325 MG tablet Take 1 tablet by mouth every 6 (six) hours as needed for severe pain. 06/02/23   Nimco Bivens A, PA-C  venlafaxine XR (EFFEXOR-XR) 150 MG 24 hr capsule Take 150 mg by mouth daily with breakfast.    [provider]  Allergies    Patient has no known allergies.    Review of Systems   Review of Systems  Constitutional:  Positive for activity change, appetite change, fatigue and fever.  HENT:  Positive for congestion.   Respiratory:  Positive for cough. Negative for apnea, choking, chest tightness, shortness of breath, wheezing and stridor.   Cardiovascular: Negative.   Gastrointestinal:  Positive for abdominal pain, diarrhea and nausea. Negative for abdominal distention, anal bleeding, blood in stool, constipation, rectal pain and vomiting.  Genitourinary: Negative.   Musculoskeletal: Negative.   Skin: Negative.   Neurological: Negative.   All  other systems reviewed and are negative.   Physical Exam Updated Vital Signs BP 110/76   Pulse 74   Temp 97.8 F (36.6 C) (Oral)   Resp 16   Wt 113.4 kg   SpO2 97%   BMI 30.43 kg/m  Physical Exam Vitals and nursing note reviewed.  Constitutional:      General: He is not in acute distress.    Appearance: He is well-developed. He is not ill-appearing, toxic-appearing or diaphoretic.  HENT:     Head: Normocephalic and atraumatic.  Eyes:     Pupils: Pupils are equal, round, and reactive to light.  Cardiovascular:     Rate and Rhythm: Normal rate and regular rhythm.     Heart sounds: Normal heart sounds.  Pulmonary:     Effort: Pulmonary effort is normal. No respiratory distress.     Breath sounds: Normal breath sounds. No wheezing, rhonchi or rales.  Chest:     Chest wall: No tenderness.  Abdominal:     General: Bowel sounds are normal. There is no distension.     Palpations: Abdomen is soft.     Tenderness: There is generalized abdominal tenderness. There is no right CVA tenderness, left CVA tenderness, guarding or rebound. Negative signs include Murphy's sign and McBurney's sign.     Hernia: No hernia is present.  Musculoskeletal:        General: Normal range of motion.     Cervical back: Normal range of motion and neck supple.  Skin:    General: Skin is warm and dry.  Neurological:     General: No focal deficit present.     Mental Status: He is alert and oriented to person, place, and time.     ED Results / Procedures / Treatments   Labs (all labs ordered are listed, but only abnormal results are displayed) Labs Reviewed  COMPREHENSIVE METABOLIC PANEL - Abnormal; Notable for the following components:      Result Value   Potassium 3.3 (*)    CO2 18 (*)    Glucose, Bld 141 (*)    Creatinine, Ser 1.51 (*)    Calcium 8.8 (*)    GFR, Estimated 55 (*)    All other components within normal limits  SARS CORONAVIRUS 2 BY RT PCR  CBC WITH DIFFERENTIAL/PLATELET   LIPASE, BLOOD  LACTIC ACID, PLASMA  URINALYSIS, ROUTINE W REFLEX MICROSCOPIC  LACTIC ACID, PLASMA    EKG None  Radiology CT ABDOMEN PELVIS W CONTRAST  Addendum Date: 06/02/2023   ADDENDUM REPORT: 06/02/2023 13:12 ADDENDUM: These findings were discussed with Ramah Langhans by Jacob Moores at 1:12 pm on 06/02/2023. Electronically Signed   By: Jacob Moores M.D.   On: 06/02/2023 13:12   Result Date: 06/02/2023 CLINICAL DATA:  Abdominal pain with associated fever and diarrhea for 3 days EXAM: CT ABDOMEN AND PELVIS WITH CONTRAST TECHNIQUE: Multidetector CT imaging  of the abdomen and pelvis was performed using the standard protocol following bolus administration of intravenous contrast. RADIATION DOSE REDUCTION: This exam was performed according to the departmental dose-optimization program which includes automated exposure control, adjustment of the mA and/or kV according to patient size and/or use of iterative reconstruction technique. CONTRAST:  OMNIPAQUE IOHEXOL 300 MG/ML  SOLN COMPARISON:  None Available. FINDINGS: Lower chest: The visualized bibasilar lungs clear. Normal heart size and no pericardial effusion. Hepatobiliary: No focal liver abnormality is seen. No gallstones, gallbladder wall thickening, or biliary dilatation. Pancreas: Unremarkable. No pancreatic ductal dilatation or surrounding inflammatory changes. Spleen: Normal in size without focal abnormality. Adrenals/Urinary Tract: Adrenal glands are unremarkable. Symmetric enhancement of bilateral kidneys no hydronephrosis. There is a nonobstructive 3 mm calyceal stone in the interpolar left kidney. 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 Stomach/Bowel: There is bowel wall thickening and edema of the distal ileum, cecum, and ascending colon, with slightly decreased bowel wall enhancement. No evidence of pneumatosis or free air. There is mild bowel wall thickening of  the transverse colon, however there is normal bowel wall enhancement. Sigmoid diverticulosis without evidence of diverticulitis. However, visualization of the bowel within the low pelvis is obscured secondary to streak artifact. No evidence of bowel obstruction. The appendix appears within normal limits. Vascular/Lymphatic: No significant vascular findings are present. No enlarged abdominal or pelvic lymph nodes. Reproductive: The prostate is obscured secondary to streak artifact in the pelvis. Other: No abdominal wall hernia or abnormality. No abdominopelvic ascites. Musculoskeletal: No acute or significant osseous findings. IMPRESSION: 1. Bowel wall thickening with associated edema and hypo-enhancement involving the distal ileum, cecum, and ascending colon. There is no evidence of pneumatosis or free air. Differential considerations include infectious or inflammatory enteritis/colitis but ischemic etiology is not excluded. 2. Sigmoid diverticulosis without evidence of diverticulitis. Electronically Signed: By: Jacob Moores M.D. On: 06/02/2023 12:58    Procedures Procedures    Medications Ordered in ED Medications  sodium chloride 0.9 % bolus 1,000 mL ( Intravenous Stopped 06/02/23 1153)  ondansetron (ZOFRAN) injection 4 mg (4 mg Intravenous Given 06/02/23 1051)  morphine (PF) 4 MG/ML injection 4 mg (4 mg Intravenous Given 06/02/23 1051)  iohexol (OMNIPAQUE) 300 MG/ML solution 100 mL (100 mLs Intravenous Contrast Given 06/02/23 1208)  ciprofloxacin (CIPRO) tablet 500 mg (500 mg Oral Given 06/02/23 1449)  metroNIDAZOLE (FLAGYL) tablet 500 mg (500 mg Oral Given 06/02/23 1449)  dicyclomine (BENTYL) capsule 10 mg (10 mg Oral Given 06/02/23 1448)   ED Course/ Medical Decision Making/ A&P   52 year old here for evaluation abdominal pain and diarrhea.  Began on Thursday.  He is afebrile, nonseptic, not ill-appearing.  Did note a temp of 102 at home a few days ago.  Some associated congestion, rhinorrhea.   No sick contacts.  No recent antibiotics, travel.  He appears overall well.  His heart and lungs are clear.  His abdomen is diffusely tender.  No urinary symptoms.  Does have history of kidney stones, no flank pain, hematuria, states does not feel similar to his prior kidney stones.  Does have known history of diverticulitis.  Will plan on labs, imaging and reassess  Labs and imaging personally viewed and interpreted:  CBC without leukocytosis CMP potassium 3.3, creatinine 1.51, similar to prior Lipase 47 CT AP colitis, infectious versus inflammatory, cannot rule out ischemic.  Discussed with radiologist they recommend lactic acid and correlation  Patient reassessed. Pain improved. Discussed labs/imaging.  He is  requesting p.o. intake.  Will p.o. challenge.  Lactic acid 0.9.  Patient without bloody stool, normal lactic acid, low suspicion for ischemic colitis.  Given his fever will treat for infectious colitis.  He notes he has history of IBS however no IBD.  [facility] Unable to provide stool sample here.  Dc home with symptomatic management, close outpatient follow-up  Patient is nontoxic, nonseptic appearing, in no apparent distress.  Patient's pain and other symptoms adequately managed in emergency department.  Fluid bolus given.  Labs, imaging and vitals reviewed.  Patient does not meet the SIRS or Sepsis criteria.  On repeat exam patient does not have a surgical abdomin and there are no peritoneal signs.  No indication of appendicitis, bowel obstruction, bowel perforation, cholecystitis, diverticulitis, ischemic bowel.    The patient has been appropriately medically screened and/or stabilized in the ED. I have low suspicion for any other emergent medical condition which would require further screening, evaluation or treatment in the ED or require inpatient management.  Patient is hemodynamically stable and in no acute distress.  Patient able to ambulate in department prior to  ED.  Evaluation does not show acute pathology that would require ongoing or additional emergent interventions while in the emergency department or further inpatient treatment.  I have discussed the diagnosis with the patient and answered all questions.  Pain is been managed while in the emergency department and patient has no further complaints prior to discharge.  Patient is comfortable with plan discussed in room and is stable for discharge at this time.  I have discussed strict return precautions for returning to the emergency department.  Patient was encouraged to follow-up with PCP/specialist refer to at discharge.                               Medical Decision Making Amount and/or Complexity of Data Reviewed External Data Reviewed: labs, radiology and notes. Labs: ordered. Decision-making details documented in ED Course. Radiology: ordered and independent interpretation performed. Decision-making details documented in ED Course.  Risk OTC drugs. Prescription drug management. Parenteral controlled substances. Decision regarding hospitalization. Diagnosis or treatment significantly limited by social determinants of health.          Final Clinical Impression(s) / ED Diagnoses Final diagnoses:  Colitis    Rx / DC Orders ED Discharge Orders          Ordered    ciprofloxacin (CIPRO) 500 MG tablet  Every 12 hours,   Status:  Discontinued        06/02/23 1432    metroNIDAZOLE (FLAGYL) 500 MG tablet  2 times daily,   Status:  Discontinued        06/02/23 1432    dicyclomine (BENTYL) 20 MG tablet  2 times daily,   Status:  Discontinued        06/02/23 1432    oxyCODONE-acetaminophen (PERCOCET/ROXICET) 5-325 MG tablet  Every 6 hours PRN,   Status:  Discontinued        06/02/23 1455    oxyCODONE-acetaminophen (PERCOCET/ROXICET) 5-325 MG tablet  Every 6 hours PRN        06/02/23 1531    ciprofloxacin (CIPRO) 500 MG tablet  Every 12 hours        06/02/23 1531    dicyclomine  (BENTYL) 20 MG tablet  2 times daily        06/02/23 1531    metroNIDAZOLE (FLAGYL) 500  MG tablet  2 times daily        06/02/23 1531              Aspyn Warnke A, PA-C 06/02/23 1532    Virgina Norfolk, DO 06/02/23 2325

## 2023-06-02 NOTE — Discharge Instructions (Addendum)
It was a pleasure taking care of you here in the emergency department  We have started you on antibiotics as well as medication to help with your abdominal pain.  Take as prescribed.  Follow-up with the [facility], return for new or worsening symptoms
